# Patient Record
Sex: Male | Born: 1965 | Race: White | Hispanic: No | Marital: Married | Smoking: Never smoker
Health system: Southern US, Community
[De-identification: ages and names within clinical notes are randomized; demographics above are authoritative.]

## PROBLEM LIST (undated history)

## (undated) DIAGNOSIS — K529 Noninfective gastroenteritis and colitis, unspecified: Secondary | ICD-10-CM

## (undated) DIAGNOSIS — F99 Mental disorder, not otherwise specified: Secondary | ICD-10-CM

## (undated) HISTORY — PX: KNEE SURGERY: SHX244

---

## 1999-08-08 ENCOUNTER — Encounter: Payer: Self-pay | Admitting: Gastroenterology

## 1999-08-08 ENCOUNTER — Encounter: Admission: RE | Admit: 1999-08-08 | Discharge: 1999-08-08 | Payer: Self-pay | Admitting: Gastroenterology

## 2000-01-05 ENCOUNTER — Other Ambulatory Visit: Admission: RE | Admit: 2000-01-05 | Discharge: 2000-01-05 | Payer: Self-pay | Admitting: Gastroenterology

## 2000-01-05 ENCOUNTER — Encounter (INDEPENDENT_AMBULATORY_CARE_PROVIDER_SITE_OTHER): Payer: Self-pay | Admitting: Specialist

## 2007-02-25 ENCOUNTER — Emergency Department (HOSPITAL_COMMUNITY): Admission: EM | Admit: 2007-02-25 | Discharge: 2007-02-25 | Payer: Self-pay | Admitting: Emergency Medicine

## 2009-04-14 ENCOUNTER — Emergency Department (HOSPITAL_COMMUNITY): Admission: EM | Admit: 2009-04-14 | Discharge: 2009-04-14 | Payer: Self-pay | Admitting: Emergency Medicine

## 2011-09-11 ENCOUNTER — Encounter: Payer: Self-pay | Admitting: Emergency Medicine

## 2011-09-11 ENCOUNTER — Emergency Department (HOSPITAL_COMMUNITY): Payer: Self-pay

## 2011-09-11 ENCOUNTER — Other Ambulatory Visit: Payer: Self-pay

## 2011-09-11 ENCOUNTER — Emergency Department (HOSPITAL_COMMUNITY)
Admission: EM | Admit: 2011-09-11 | Discharge: 2011-09-11 | Disposition: A | Discharge status: Home / Self Care | Payer: Self-pay | Attending: Emergency Medicine | Admitting: Emergency Medicine

## 2011-09-11 DIAGNOSIS — R6883 Chills (without fever): Secondary | ICD-10-CM | POA: Insufficient documentation

## 2011-09-11 DIAGNOSIS — R079 Chest pain, unspecified: Secondary | ICD-10-CM | POA: Insufficient documentation

## 2011-09-11 DIAGNOSIS — R0602 Shortness of breath: Secondary | ICD-10-CM | POA: Insufficient documentation

## 2011-09-11 DIAGNOSIS — J40 Bronchitis, not specified as acute or chronic: Secondary | ICD-10-CM | POA: Insufficient documentation

## 2011-09-11 HISTORY — DX: Noninfective gastroenteritis and colitis, unspecified: K52.9

## 2011-09-11 MED ORDER — HYDROCOD POLST-CHLORPHEN POLST 10-8 MG/5ML PO LQCR: 5.0000 mL | Freq: Two times a day (BID) | ORAL | Status: DC | PRN

## 2011-09-11 MED ORDER — KETOROLAC TROMETHAMINE 30 MG/ML IJ SOLN
30.0000 mg | Freq: Once | INTRAMUSCULAR | Status: AC
  Administered 2011-09-11: 30 mg via INTRAVENOUS
  Filled 2011-09-11: qty 1

## 2011-09-11 MED ORDER — ALBUTEROL SULFATE HFA 108 (90 BASE) MCG/ACT IN AERS: 1.0000 | INHALATION_SPRAY | Freq: Four times a day (QID) | RESPIRATORY_TRACT | Status: DC | PRN

## 2011-09-11 NOTE — ED Notes (Signed)
Per pt's wife she administered her daughter's albuterol and pt's states it helped and he feels better

## 2011-09-11 NOTE — ED Provider Notes (Signed)
History     CSN: [id number] Arrival date & time: 09/11/2011  4:02 PM   First MD Initiated Contact with Patient 09/11/11 1920      Chief Complaint  Patient presents with  . Chest Pain    (Consider location/radiation/quality/duration/timing/severity/associated sxs/prior treatment) Patient is a 45 y.o. male presenting with chest pain. The history is provided by the patient.  Chest Pain Primary symptoms include shortness of breath, cough and wheezing. Pertinent negatives for primary symptoms include no fever, no palpitations, no abdominal pain, no nausea and no vomiting.  Pertinent negatives for associated symptoms include no diaphoresis.    the patient is a 40, old male, with no past medical history, who does not smoke, who presents to the emergency department complaining of a cough, causing, chest pain, along with mild shortness of breath and wheezing.  For several weeks.  He says when he coughs, it feels like carrying in his chest.  He also has had green sputum.  He denies nausea, vomiting, fevers.  He has had chills.  He denies diaphoresis.  Past Medical History  Diagnosis Date  . Colitis     Past Surgical History  Procedure Date  . Knee surgery     No family history on file.  History  Substance Use Topics  . Smoking status: Never Smoker   . Smokeless tobacco: Not on file  . Alcohol Use: Yes     occasionally      Review of Systems  Constitutional: Positive for chills. Negative for fever and diaphoresis.  HENT: Negative for neck pain.   Eyes: Negative for redness.  Respiratory: Positive for cough, shortness of breath and wheezing. Negative for chest tightness.   Cardiovascular: Positive for chest pain. Negative for palpitations and leg swelling.  Gastrointestinal: Negative for nausea, vomiting, abdominal pain and diarrhea.  Musculoskeletal: Negative for back pain.  Skin: Negative for rash.  Neurological: Negative for headaches.  Psychiatric/Behavioral: Negative  for confusion.  All other systems reviewed and are negative.    Allergies  Codeine; Penicillins; and Tramadol  Home Medications   Current Outpatient Rx  Name Route Sig Dispense Refill  . ACETAMINOPHEN 500 MG PO TABS Oral Take 1,000 mg by mouth every 6 (six) hours as needed. For pain     . ALBUTEROL SULFATE (2.5 MG/3ML) 0.083% IN NEBU Nebulization Take 2.5 mg by nebulization every 6 (six) hours as needed. For shortness of breath       BP 112/64  Pulse 77  Temp(Src) 100.1 F (37.8 C) (Oral)  Resp 18  SpO2 96%  Physical Exam  Constitutional: He is oriented to person, place, and time. He appears well-developed and well-nourished. No distress.  HENT:  Head: Normocephalic and atraumatic.  Eyes: EOM are normal. Pupils are equal, round, and reactive to light.  Neck: Normal range of motion. Neck supple.  Cardiovascular: Normal rate, regular rhythm and normal heart sounds.   No murmur heard. Pulmonary/Chest: Effort normal and breath sounds normal. No respiratory distress. He has no wheezes. He has no rales.  Abdominal: Soft. Bowel sounds are normal. He exhibits no distension and no mass. There is no tenderness. There is no rebound and no guarding.  Musculoskeletal: Normal range of motion. He exhibits no edema and no tenderness.  Neurological: He is alert and oriented to person, place, and time. No cranial nerve deficit.  Skin: Skin is warm and dry. He is not diaphoretic.  Psychiatric: He has a normal mood and affect. His behavior is normal.  ED Course  Procedures (including critical care time) 45 year old male, with persistent cough for several weeks.  Cough causing, chest pain.  He is not hypoxic.  Her respiratory distress.  He has no underlying past medical history does not smoke.  We'll perform a chest x-ray, I suspect that he stopped, bronchitis.  There is no indication for laboratory testing.  At this time to  Labs Reviewed - No data to display No results found for this or  any previous visit. Dg Chest 2 View  09/11/2011  *RADIOLOGY REPORT*  Clinical Data: Cough and chest pain  CHEST - 2 VIEW  Comparison: None.  Findings: The lungs are clear without focal infiltrate, edema, pneumothorax or pleural effusion. Interstitial markings are diffusely coarsened with chronic features. The cardiopericardial silhouette is within normal limits for size. Imaged bony structures of the thorax are intact.  IMPRESSION: Chronic interstitial coarsening without acute findings.  Original Report Authenticated By: ERIC A. MANSELL, M.D.       MDM  Bronchitis No pneumonia, hypoxia, respiratory distress or other acute illness        Nicholes Stairs, MD 09/11/11 2049

## 2011-09-11 NOTE — ED Notes (Signed)
Pt presenting to ed with c/o chest pain with positive nausea, pt reports shortness of breath and pain radiating into his back and between his shoulder blades. Pt states symptoms x over 1 month. Pt states he's also coughing up greenish colored sputum. Pt states it feels like something is ripping apart in his chest when he coughs. Pt states he has also had fevers.

## 2012-09-28 ENCOUNTER — Emergency Department (HOSPITAL_COMMUNITY): Payer: Self-pay

## 2012-09-28 ENCOUNTER — Encounter (HOSPITAL_COMMUNITY): Payer: Self-pay | Admitting: Emergency Medicine

## 2012-09-28 ENCOUNTER — Encounter (HOSPITAL_COMMUNITY): Payer: Self-pay

## 2012-09-28 ENCOUNTER — Emergency Department (HOSPITAL_COMMUNITY)
Admission: EM | Admit: 2012-09-28 | Discharge: 2012-09-28 | Disposition: A | Discharge status: Other Acute Inpatient Hospital | Payer: Self-pay | Attending: Emergency Medicine | Admitting: Emergency Medicine

## 2012-09-28 ENCOUNTER — Inpatient Hospital Stay (HOSPITAL_COMMUNITY)
Admission: RE | Admit: 2012-09-28 | Discharge: 2012-10-03 | DRG: 885 | Disposition: A | Discharge status: Home / Self Care | Payer: Federal, State, Local not specified - Other | Source: Ambulatory Visit | Attending: Psychiatry | Admitting: Psychiatry

## 2012-09-28 DIAGNOSIS — F1410 Cocaine abuse, uncomplicated: Secondary | ICD-10-CM | POA: Insufficient documentation

## 2012-09-28 DIAGNOSIS — F411 Generalized anxiety disorder: Secondary | ICD-10-CM | POA: Insufficient documentation

## 2012-09-28 DIAGNOSIS — F419 Anxiety disorder, unspecified: Secondary | ICD-10-CM

## 2012-09-28 DIAGNOSIS — Z9889 Other specified postprocedural states: Secondary | ICD-10-CM | POA: Insufficient documentation

## 2012-09-28 DIAGNOSIS — F3289 Other specified depressive episodes: Secondary | ICD-10-CM | POA: Insufficient documentation

## 2012-09-28 DIAGNOSIS — Z8719 Personal history of other diseases of the digestive system: Secondary | ICD-10-CM | POA: Insufficient documentation

## 2012-09-28 DIAGNOSIS — Z79899 Other long term (current) drug therapy: Secondary | ICD-10-CM | POA: Insufficient documentation

## 2012-09-28 DIAGNOSIS — F329 Major depressive disorder, single episode, unspecified: Secondary | ICD-10-CM

## 2012-09-28 DIAGNOSIS — F1994 Other psychoactive substance use, unspecified with psychoactive substance-induced mood disorder: Secondary | ICD-10-CM | POA: Diagnosis present

## 2012-09-28 DIAGNOSIS — F489 Nonpsychotic mental disorder, unspecified: Secondary | ICD-10-CM | POA: Insufficient documentation

## 2012-09-28 DIAGNOSIS — F3189 Other bipolar disorder: Principal | ICD-10-CM | POA: Diagnosis present

## 2012-09-28 HISTORY — DX: Mental disorder, not otherwise specified: F99

## 2012-09-28 LAB — COMPREHENSIVE METABOLIC PANEL
ALT: 16 U/L (ref 0–53)
BUN: 16 mg/dL (ref 6–23)
CO2: 25 mEq/L (ref 19–32)
Calcium: 10.1 mg/dL (ref 8.4–10.5)
Creatinine, Ser: 1.05 mg/dL (ref 0.50–1.35)
GFR calc Af Amer: >90 mL/min (ref >90)
GFR calc non Af Amer: 83 mL/min — ABNORMAL LOW (ref >90)
Glucose, Bld: 114 mg/dL — ABNORMAL HIGH (ref 70–99)
Sodium: 137 mEq/L (ref 135–145)
Total Protein: 8.3 g/dL (ref 6.0–8.3)

## 2012-09-28 LAB — CBC
HCT: 43.8 % (ref 39.0–52.0)
Hemoglobin: 15.1 g/dL (ref 13.0–17.0)
MCH: 30.6 pg (ref 26.0–34.0)
MCHC: 34.5 g/dL (ref 30.0–36.0)
MCV: 88.8 fL (ref 78.0–100.0)
RBC: 4.93 MIL/uL (ref 4.22–5.81)

## 2012-09-28 LAB — RAPID URINE DRUG SCREEN, HOSP PERFORMED
Cocaine: POSITIVE — AB
Opiates: NOT DETECTED
Tetrahydrocannabinol: NOT DETECTED

## 2012-09-28 LAB — ETHANOL: Alcohol, Ethyl (B): <11 mg/dL (ref 0–11)

## 2012-09-28 MED ORDER — MAGNESIUM HYDROXIDE 400 MG/5ML PO SUSP: 30.0000 mL | Freq: Every day | ORAL | Status: DC | PRN

## 2012-09-28 MED ORDER — ONDANSETRON HCL 4 MG PO TABS: 4.0000 mg | ORAL_TABLET | Freq: Three times a day (TID) | ORAL | Status: DC | PRN

## 2012-09-28 MED ORDER — ALUM & MAG HYDROXIDE-SIMETH 200-200-20 MG/5ML PO SUSP
30.0000 mL | ORAL | Status: DC | PRN
  Administered 2012-10-02: 30 mL via ORAL

## 2012-09-28 MED ORDER — IBUPROFEN 600 MG PO TABS
600.0000 mg | ORAL_TABLET | Freq: Three times a day (TID) | ORAL | Status: DC | PRN
  Administered 2012-09-28: 600 mg via ORAL
  Filled 2012-09-28: qty 1

## 2012-09-28 MED ORDER — ACETAMINOPHEN 325 MG PO TABS
650.0000 mg | ORAL_TABLET | Freq: Four times a day (QID) | ORAL | Status: DC | PRN
  Administered 2012-09-29 – 2012-10-01 (×4): 650 mg via ORAL

## 2012-09-28 MED ORDER — ALUM & MAG HYDROXIDE-SIMETH 200-200-20 MG/5ML PO SUSP: 30.0000 mL | ORAL | Status: DC | PRN

## 2012-09-28 MED ORDER — ACETAMINOPHEN 325 MG PO TABS
650.0000 mg | ORAL_TABLET | ORAL | Status: DC | PRN
  Administered 2012-09-28: 650 mg via ORAL
  Filled 2012-09-28: qty 2

## 2012-09-28 MED ORDER — ZOLPIDEM TARTRATE 5 MG PO TABS: 5.0000 mg | ORAL_TABLET | Freq: Every evening | ORAL | Status: DC | PRN

## 2012-09-28 MED ORDER — LORAZEPAM 1 MG PO TABS: 1.0000 mg | ORAL_TABLET | Freq: Three times a day (TID) | ORAL | Status: DC | PRN

## 2012-09-28 NOTE — ED Notes (Signed)
Patient is in paper scrubs and sheriff is present
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
## 2012-09-28 NOTE — ED Notes (Signed)
Up on the phone 

## 2012-09-28 NOTE — Tx Team (Signed)
Initial Interdisciplinary Treatment Plan  PATIENT STRENGTHS: (choose at least two) Ability for insight Active sense of humor Average or above average intelligence Capable of independent living Communication skills General fund of knowledge Supportive family/friends  PATIENT STRESSORS: Marital or family conflict   PROBLEM LIST: Problem List/Patient Goals Date to be addressed Date deferred Reason deferred Estimated date of resolution                                                         DISCHARGE CRITERIA:  Ability to meet basic life and health needs Improved stabilization in mood, thinking, and/or behavior Need for constant or close observation no longer present Safe-care adequate arrangements made  PRELIMINARY DISCHARGE PLAN: Participate in family therapy Return to previous living arrangement  PATIENT/FAMIILY INVOLVEMENT: This treatment plan has been presented to and reviewed with the patient, Justin Gross, and/or family member, .  The patient and family have been given the opportunity to ask questions and make suggestions.  Justin Gross 09/28/2012, 3:58 PM

## 2012-09-28 NOTE — ED Notes (Signed)
Up to the bathroom 
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
## 2012-09-28 NOTE — ED Notes (Signed)
Back from x ray, ambulatory

## 2012-09-28 NOTE — ED Notes (Signed)
Patient belongings were placed in locker #36, one bag.
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
## 2012-09-28 NOTE — ED Provider Notes (Signed)
Medical screening examination/treatment/procedure(s) were performed by non-physician practitioner and as supervising physician I was immediately available for consultation/collaboration.  Sunnie Nielsen, MD 09/28/12 [phone]

## 2012-09-28 NOTE — ED Notes (Signed)
Deputy at bedside spoke with pts wife, states she is fearful of pt and is afraid to come to ED. Is willing to speak with MD or ACT. April Quinley [phone]

## 2012-09-28 NOTE — ED Notes (Signed)
Sheriff's dept here to transport  

## 2012-09-28 NOTE — ED Notes (Signed)
1 bag of belongings, MAR, telepsych, ACT and IVC papers sent w/ pt

## 2012-09-28 NOTE — ED Provider Notes (Signed)
History     CSN: [id number]  Arrival date & time 09/28/12  0035   First MD Initiated Contact with Patient 09/28/12 0244      Chief Complaint  Patient presents with  . Medical Clearance    (Consider location/radiation/quality/duration/timing/severity/associated sxs/prior treatment) HPI Comments: Patient comes in via [employer] with IVC paperwork that was filled out by the patient's wife.  The paperwork states that earlier this evening the patient had a gun to his head and was threatening to shoot himself.  Patient denies this.  The wife then called the police.  Patient apparently was combative when police arrived at the home.  When police arrived at the home the patient had a gun in his hand.   Patient denies prior suicide attempt.  He denies prior psychiatric history.  Denies recent alcohol or drug use.    The history is provided by the patient.    Past Medical History  Diagnosis Date  . Colitis     Past Surgical History  Procedure Date  . Knee surgery     No family history on file.  History  Substance Use Topics  . Smoking status: Never Smoker   . Smokeless tobacco: Not on file  . Alcohol Use: Yes     Comment: occasionally      Review of Systems  All other systems reviewed and are negative.    Allergies  Codeine; Penicillins; and Tramadol  Home Medications   Current Outpatient Rx  Name  Route  Sig  Dispense  Refill  . ACETAMINOPHEN 500 MG PO TABS   Oral   Take 1,000 mg by mouth every 6 (six) hours as needed. For pain          . ALBUTEROL SULFATE HFA 108 (90 BASE) MCG/ACT IN AERS   Inhalation   Inhale 1-2 puffs into the lungs every 6 (six) hours as needed for wheezing.   1 Inhaler   0   . ALBUTEROL SULFATE (2.5 MG/3ML) 0.083% IN NEBU   Nebulization   Take 2.5 mg by nebulization every 6 (six) hours as needed. For shortness of breath          . HYDROCOD POLST-CPM POLST ER 10-8 MG/5ML PO LQCR   Oral   Take 5 mLs by mouth every 12 (twelve) hours as  needed.   140 mL   0     BP 145/88  Pulse 105  Temp 98.5 F (36.9 C) (Oral)  Resp 20  SpO2 97%  Physical Exam  Nursing note and vitals reviewed. Constitutional: He appears well-developed and well-nourished. No distress.  HENT:  Head: Normocephalic and atraumatic.  Mouth/Throat: Oropharynx is clear and moist.  Eyes: EOM are normal. Pupils are equal, round, and reactive to light.  Cardiovascular: Normal rate, regular rhythm and normal heart sounds.   Pulmonary/Chest: Effort normal and breath sounds normal.  Neurological: He is alert.  Skin: Skin is warm and dry. He is not diaphoretic.  Psychiatric: His speech is rapid and/or pressured. He is agitated. He is not withdrawn and not combative. He exhibits a depressed mood. He expresses no homicidal and no suicidal ideation. He expresses no suicidal plans and no homicidal plans.    ED Course  Procedures (including critical care time)  Labs Reviewed  CBC - Abnormal; Notable for the following:    WBC 12.4 (*)     All other components within normal limits  COMPREHENSIVE METABOLIC PANEL - Abnormal; Notable for the following:    Glucose, Bld 114 (*)  Alkaline Phosphatase 130 (*)     GFR calc non Af Amer 83 (*)     All other components within normal limits  SALICYLATE LEVEL - Abnormal; Notable for the following:    Salicylate Lvl <2.0 (*)     All other components within normal limits  URINE RAPID DRUG SCREEN (HOSP PERFORMED) - Abnormal; Notable for the following:    Cocaine POSITIVE (*)     All other components within normal limits  ACETAMINOPHEN LEVEL  ETHANOL   No results found.   No diagnosis found.    MDM  Patient presents to the ED with IVC paperwork that had been filled out by patient's wife.  Paperwork states that the patient had a gun to his head and was threatening to kill himself.  Patient discussed with ACT team.  Psych holding orders have been placed.          Pascal Lux [place], PA-C 09/28/12 [phone]

## 2012-09-28 NOTE — ED Notes (Signed)
Dr Denton Lank in w/ pt

## 2012-09-28 NOTE — ED Notes (Signed)
Dr steinl into see 
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
## 2012-09-28 NOTE — ED Notes (Signed)
Per shift report-previous nurse Joanie Coddington)  reported that the phone report from telepsych MD recommended monitoring pt x24 hours and observing/documenting behaviors, then re-telpsysch for eval, Dr Denton Lank aware.

## 2012-09-28 NOTE — ED Notes (Signed)
[employer] contacted for transport 

## 2012-09-28 NOTE — ED Notes (Signed)
ACT in w/ pt 
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
## 2012-09-28 NOTE — BHH Counselor (Addendum)
Patient accepted to [facility] by Alvy Beal, NP to Dr. Geoffery Lyons. Patients bed assignment is 303-1. The EDP-Dr. Cathren Laine was notified and updated with patients disposition. He agreed to discharge patient to [facility] for inpatient treatment. Patients nurse Wille Celeste will also be made aware of patients disposition. The call report # is [phone]. All support paperwork is completed and faxed to [facility]. Patient is involuntary and to be transported via [employer].
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
## 2012-09-28 NOTE — ED Notes (Signed)
ACT into see 

## 2012-09-28 NOTE — Progress Notes (Signed)
Patient did attend the evening speaker AA meeting.  

## 2012-09-29 DIAGNOSIS — F1994 Other psychoactive substance use, unspecified with psychoactive substance-induced mood disorder: Secondary | ICD-10-CM | POA: Diagnosis present

## 2012-09-29 DIAGNOSIS — F1410 Cocaine abuse, uncomplicated: Secondary | ICD-10-CM | POA: Diagnosis present

## 2012-09-29 MED ORDER — ESCITALOPRAM OXALATE 10 MG PO TABS
10.0000 mg | ORAL_TABLET | Freq: Every day | ORAL | Status: DC
  Administered 2012-10-01 – 2012-10-03 (×3): 10 mg via ORAL
  Filled 2012-09-29 (×3): qty 14
  Filled 2012-09-29 (×3): qty 1

## 2012-09-29 MED ORDER — HYDROXYZINE HCL 50 MG PO TABS
50.0000 mg | ORAL_TABLET | Freq: Every evening | ORAL | Status: DC | PRN
  Administered 2012-09-30 – 2012-10-02 (×3): 50 mg via ORAL
  Filled 2012-09-29: qty 1
  Filled 2012-09-29: qty 14
  Filled 2012-09-29 (×2): qty 1

## 2012-09-29 MED ORDER — ARIPIPRAZOLE 5 MG PO TABS
5.0000 mg | ORAL_TABLET | Freq: Every day | ORAL | Status: DC
  Administered 2012-10-01 – 2012-10-03 (×3): 5 mg via ORAL
  Filled 2012-09-29: qty 14
  Filled 2012-09-29 (×2): qty 1
  Filled 2012-09-29: qty 14
  Filled 2012-09-29: qty 1
  Filled 2012-09-29: qty 14

## 2012-09-29 NOTE — Progress Notes (Signed)
GOALS GROUP This group is about being able to set a goal or be able to state what you are working on or grateful for. An Attitude of Gratitude.  Today Pt attended, participated in group interaction and was spontaneous with thoughts and suggestions for the rest of the group.  

## 2012-09-29 NOTE — Progress Notes (Signed)
Psychoeducational Group Note  Date:  09/29/2012 Time:  1015  Group Topic/Focus:  Making Healthy Choices:   The focus of this group is to help patients identify negative/unhealthy choices they were using prior to admission and identify positive/healthier coping strategies to replace them upon discharge.  Participation Level:  Active  Participation Quality:  Appropriate  Affect:  Appropriate  Cognitive:  Alert  Insight:  Developing/Improving  Engagement in Group:  Developing/Improving  Additional Comments:    Justin Gross A 09/29/2012   

## 2012-09-29 NOTE — Progress Notes (Signed)
D   Pt is appropriate and pleasant   He attends and participates in groups  He interacts well with others  He denies SI/HI He denies A/V hallucinations    His thinking is logical and speech coherent   He denies any symptoms of withdrawal A   Verbal support given   Medications administered and effectiveness monitored   Q 15 min checks R   Pt safe at present

## 2012-09-29 NOTE — [facility] Suicide Risk Assessment (Addendum)
Suicide Risk Assessment  Admission Assessment     Demographic factors:  Assessment Details Time of Assessment: Admission Information Obtained From: Patient Current Mental Status:  Current Mental Status: NA Loss Factors:  Loss Factors: NA Historical Factors:  Historical Factors: NA Risk Reduction Factors:  Risk Reduction Factors: Sense of responsibility to family;Religious beliefs about death;Employed;Positive social support;Positive coping skills or problem solving skills  CLINICAL FACTORS:   Depression:   Anhedonia Comorbid alcohol abuse/dependence Impulsivity Chronic Pain  COGNITIVE FEATURES THAT CONTRIBUTE TO RISK:  Polarized thinking    SUICIDE RISK:   Minimal: No identifiable suicidal ideation.  Patients presenting with no risk factors but with morbid ruminations; may be classified as minimal risk based on the severity of the depressive symptoms  PLAN OF CARE: 1.  Participate in group sessions, talk with Child psychotherapist. Pt has  IVC for 5 days 2.  Pt will report any side effects of medication . 3.  Pt will report any thought of suicide at lese two days prior to discharge. 4   Pt may be referred to outpatient therapy.   Justin Gross 09/29/2012, 10:31 AM

## 2012-09-29 NOTE — H&P (Signed)
Psychiatric Admission Assessment Adult  Patient Identification:  Justin Gross  Date of Evaluation:  09/29/2012  Chief Complaint:  MOOD D/O,NOS  History of Present Illness: This is a 46 year old Caucasian male, admitted to [facility] from the [facility] ED with complaints of suicidal threats with a gun. Patient is currently under IVC. Patient reports, "The cops took me to the hospital on Friday (2 days ago) against my will. What happened was, on Thursday night, I had dozed off while on the couch watching the television. I woke up and made it to the bedroom where my wife was sleeping. I started to lie down beside my wife when she said, get out of this bed. Go on. My wife does this sometimes because she is going through menopause right now. She gets hot-flash often, and when she is having her hot flash, she will become cranky too. I got upset and said okay, I am going out of the house, with hope that she will get worried and call me back to the bed. [place] for protection and also for hunting. I'm licensed to carry a concealed weapon as well. I was not going to go outside of the house that time of the night without protection. My wife saw me take the gun and called 911. I went out of the house, cooled off, got back in the house when I saw bunch of flashing lights. I called my son and asked him what was going on outside? I don't know he answered. So, I took my pistol and opened the door to see what it was. I was also worried if those were burglars as well. The next thing I saw where bunch of cops rushing into my house, including the sheriff. They threw me on the ground saying that I was threatening suicide and waving a gun at my family. That was when I realized that my wife had actually called them because she was scared when she saw me take my gun. She does not like guns. I am not depressed, suicidal and or homicidal. I love my life, my family and my job. 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 I love what I do. 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 But I don't do it all the time. I decline to be on any medication at this time".   Elements:  Location:  [facility] adult unit. Quality:  "The whole incident that led to be being here is making me worried". Severity:  "I'm shaking up pretty bad by this because I'm not a violent persont". Timing:  "It started Christmas night whem my wife asked me to get out of our bed". Duration:  "It lasted just that night, until the cops showed up at my house".. 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  Associated Signs/Synptoms:  Depression Symptoms:  "I'm not depressed, and I don't feel depressed"  (Hypo) Manic Symptoms:  Flight of Ideas,  Anxiety Symptoms:  Excessive Worry,  Psychotic Symptoms:  Hallucinations: None  PTSD Symptoms: Had a traumatic exposure:  Denies  Psychiatric Specialty Exam: Physical Exam  Constitutional: He is oriented to person, place, and time. He appears well-developed and well-nourished.  HENT:  Head: Normocephalic.  Eyes: Pupils are equal, round, and reactive  to light.  Neck: Normal range of motion.  Cardiovascular: Normal rate.   Respiratory: Effort normal.  GI: Soft.  Musculoskeletal: Normal range of motion.  Neurological: He is alert and oriented to person, place, and time.  Skin: Skin is warm and dry.  Psychiatric: His behavior is normal. Thought content normal. His mood appears anxious. His speech is tangential (Talkative and fast). He expresses impulsivity.    Review of Systems  Constitutional: Negative.   HENT: Negative.   Respiratory: Negative.   Cardiovascular: Negative.   Gastrointestinal: Negative.   Genitourinary: Negative.   Musculoskeletal: Negative.   Skin: Positive for rash (Small abrasion back of left wrists.).    Neurological: Negative.   Endo/Heme/Allergies: Negative.   Psychiatric/Behavioral: Positive for substance abuse ("I use cocaine occasionally"). Negative for depression, suicidal ideas, hallucinations and memory loss. The patient is nervous/anxious ("I'm anxious about being here"). The patient does not have insomnia.     Blood pressure 135/88, pulse 79, temperature 97.1 F (36.2 C), temperature source Oral, resp. rate 20.There is no height or weight on file to calculate BMI.  General Appearance: Fairly Groomed  Patent attorney::  Good  Speech:  Clear and Coherent and Fast  Volume:  Increased  Mood:  "Up scale, I feel very good"  Affect:  Appropriate  Thought Process:  Tangential  Orientation:  Full (Time, Place, and Person)  Thought Content:  Denies hallucination, delusions and or paranoia  Suicidal Thoughts:  No  Homicidal Thoughts:  No  Memory:  Immediate;   Good Recent;   Good Remote;   Good  Judgement:  Fair  Insight:  Lacking  Psychomotor Activity:  Restlessness  Concentration:  Fair  Recall:  Good  Akathisia:  No  Handed:  Right  AIMS (if indicated):     Assets:  Desire for Improvement  Sleep:  Number of Hours: 3.75     Past Psychiatric History: Diagnosis: Cocaine abuse  Hospitalizations: [facility]  Outpatient Care: None reported  Substance Abuse Care: None reported  Self-Mutilation: Denies  Suicidal Attempts: Denies attempts and or thoughts.  Violent Behaviors: None reported   Past Medical History:   Past Medical History  Diagnosis Date  . Colitis   . Mental disorder     Allergies:   Allergies  Allergen Reactions  . Codeine Nausea Only  . Penicillins Other (See Comments)    Causes chron's flare up  . Tramadol Nausea Only   PTA Medications: Prescriptions prior to admission  Medication Sig Dispense Refill  . acetaminophen (TYLENOL) 500 MG tablet Take 1,000 mg by mouth every 6 (six) hours as needed. For pain       . albuterol (PROVENTIL HFA;VENTOLIN HFA) 108 (90  BASE) MCG/ACT inhaler Inhale 1-2 puffs into the lungs every 6 (six) hours as needed for wheezing.  1 Inhaler  0  . Multiple Vitamin (MULTIVITAMIN WITH MINERALS) TABS Take 1 tablet by mouth every morning.        Previous Psychotropic Medications:  Medication/Dose  "I use inhalers sometimes"               Substance Abuse History in the last 12 months:  yes  Consequences of Substance Abuse: Medical Consequences:  Liver damage, Possible death by overdose Legal Consequences:  Arrests, jail time, Loss of driving privilege. Family Consequences:  Family discord, divorce and or separation.   Social History:  reports that he has never smoked. He does not have any smokeless tobacco history on file. He reports that he drinks alcohol. He  reports that he does not use illicit drugs. Additional Social History:  Current Place of Residence: [place], [place]   Place of Birth: [place], [place]   Family Members: "My wife and children"  Marital Status:  Married  Children: 3  Sons: 1  Daughters: 2  Relationships: Married  Education:  GED  Educational Problems/Performance: "I got my GED"  Religious Beliefs/Practices: "I'm a christian"  History of Abuse (Emotional/Phsycial/Sexual): None reported  Occupational Experiences: Employed  Hotel manager History:  None.  Legal History: None reported  Hobbies/Interests: None reported  Family History:  No family history on file.  Results for orders placed during the hospital encounter of 09/28/12 (from the past 72 hour(s))  URINE RAPID DRUG SCREEN (HOSP PERFORMED)     Status: Abnormal   Collection Time   09/28/12  1:24 AM      Component Value Range Comment   Opiates NONE DETECTED  NONE DETECTED    Cocaine POSITIVE (*) NONE DETECTED    Benzodiazepines NONE DETECTED  NONE DETECTED    Amphetamines NONE DETECTED  NONE DETECTED    Tetrahydrocannabinol NONE DETECTED  NONE DETECTED    Barbiturates NONE DETECTED  NONE DETECTED   ACETAMINOPHEN  LEVEL     Status: Normal   Collection Time   09/28/12  1:40 AM      Component Value Range Comment   Acetaminophen (Tylenol), Serum <15.0  10 - 30 ug/mL   CBC     Status: Abnormal   Collection Time   09/28/12  1:40 AM      Component Value Range Comment   WBC 12.4 (*) 4.0 - 10.5 K/uL    RBC 4.93  4.22 - 5.81 MIL/uL    Hemoglobin 15.1  13.0 - 17.0 g/dL    HCT 32.4  40.1 - 02.7 %    MCV 88.8  78.0 - 100.0 fL    MCH 30.6  26.0 - 34.0 pg    MCHC 34.5  30.0 - 36.0 g/dL    RDW 25.3  66.4 - 40.3 %    Platelets 275  150 - 400 K/uL   COMPREHENSIVE METABOLIC PANEL     Status: Abnormal   Collection Time   09/28/12  1:40 AM      Component Value Range Comment   Sodium 137  135 - 145 mEq/L    Potassium 3.6  3.5 - 5.1 mEq/L    Chloride 101  96 - 112 mEq/L    CO2 25  19 - 32 mEq/L    Glucose, Bld 114 (*) 70 - 99 mg/dL    BUN 16  6 - 23 mg/dL    Creatinine, Ser 4.74  0.50 - 1.35 mg/dL    Calcium 25.9  8.4 - 10.5 mg/dL    Total Protein 8.3  6.0 - 8.3 g/dL    Albumin 4.6  3.5 - 5.2 g/dL    AST 17  0 - 37 U/L    ALT 16  0 - 53 U/L    Alkaline Phosphatase 130 (*) 39 - 117 U/L    Total Bilirubin 0.4  0.3 - 1.2 mg/dL    GFR calc non Af Amer 83 (*) >90 mL/min    GFR calc Af Amer >90  >90 mL/min   ETHANOL     Status: Normal   Collection Time   09/28/12  1:40 AM      Component Value Range Comment   Alcohol, Ethyl (B) <11  0 - 11 mg/dL   SALICYLATE LEVEL  Status: Abnormal   Collection Time   09/28/12  1:40 AM      Component Value Range Comment   Salicylate Lvl <2.0 (*) 2.8 - 20.0 mg/dL    Psychological Evaluations:  Assessment:   AXIS I:  Cocaine abuse AXIS II:  Deferred AXIS III:   Past Medical History  Diagnosis Date  . Colitis   . Mental disorder    AXIS IV:  other psychosocial or environmental problems AXIS V:  11-20 some danger of hurting self or others possible OR occasionally fails to maintain minimal personal hygiene OR gross impairment in communication    Treatment  Plan/Recommendations: 1. Admit for crisis management and stabilization.  2. Medication management to reduce current symptoms to base line and improve the patient's overall level of functioning  3. Treat health problems as indicated.  4. Develop treatment plan to decrease risk of relapse upon discharge and the need for readmission.  5. Psycho-social education regarding relapse prevention and self care.  6. Health care follow up as needed for medical problems.  7. Review and reinstate any pertinent home medications for other issues where appropriate.   Treatment Plan Summary: Daily contact with patient to assess and evaluate symptoms and progress in treatment Medication management  Current Medications:  Current Facility-Administered Medications  Medication Dose Route Frequency Provider Last Rate Last Dose  . acetaminophen (TYLENOL) tablet 650 mg  650 mg Oral Q6H PRN Shuvon Rankin, NP   650 mg at 09/29/12 0055  . alum & mag hydroxide-simeth (MAALOX/MYLANTA) 200-200-20 MG/5ML suspension 30 mL  30 mL Oral Q4H PRN Shuvon Rankin, NP      . magnesium hydroxide (MILK OF MAGNESIA) suspension 30 mL  30 mL Oral Daily PRN Shuvon Rankin, NP        Observation Level/Precautions:  Q15 minute safety checks.  Laboratory:  Reviewed ED lab findings on file.  Psychotherapy:  Group sessions  Medications:  See medication lists  Consultations:  None indicated at this time.  Discharge Concerns:  Safety and sobriety  Estimated LOS: 3-5 days  Other:     I certify that inpatient services furnished can reasonably be expected to improve the patient's condition.   Armandina Stammer I 12/29/20139:07 AM

## 2012-09-29 NOTE — Clinical Social Work Psychosocial (Signed)
[facility] Group Notes: (Clinical Social Work)   09/29/2012      Type of Therapy:  Group Therapy   Participation Level:  Did Not Attend    Ambrose Mantle, LCSW 09/29/2012, 12:21 PM

## 2012-09-29 NOTE — Progress Notes (Signed)
D.  Pt bright and pleasant on approach.  Denies complaints at this time.  Denies SI/HI/hallucinations.  Positive for evening AA group, interacting appropriately within the milieu.  A.  Support and encouragement offered  R.  No distress noted, will continue to monitor.

## 2012-09-29 NOTE — Progress Notes (Signed)
Patient did attend the evening speaker AA meeting.  

## 2012-09-30 ENCOUNTER — Encounter (HOSPITAL_COMMUNITY): Payer: Self-pay | Admitting: Psychiatry

## 2012-09-30 DIAGNOSIS — F1410 Cocaine abuse, uncomplicated: Secondary | ICD-10-CM

## 2012-09-30 DIAGNOSIS — F1994 Other psychoactive substance use, unspecified with psychoactive substance-induced mood disorder: Secondary | ICD-10-CM

## 2012-09-30 DIAGNOSIS — F1010 Alcohol abuse, uncomplicated: Secondary | ICD-10-CM

## 2012-09-30 NOTE — Progress Notes (Signed)
Patient ID: Justin Gross, male   DOB: [birth year], 46 y.o.   MRN: [id number]  He has been up and about and to groups interacting with peers and staff. He refused to take his morning medication until after he sees th DR. He denies having any problems that he is OK.

## 2012-09-30 NOTE — Progress Notes (Signed)
[facility] INPATIENT:  Family/Significant Other Suicide Prevention Education  Suicide Prevention Education:  Education Completed; Destin Stiefel - son [phone]),  (name of family member/significant other) has been identified by the patient as the family member/significant other with whom the patient will be residing, and identified as the person(s) who will aid the patient in the event of a mental health crisis (suicidal ideations/suicide attempt).  With written consent from the patient, the family member/significant other has been provided the following suicide prevention education, prior to the and/or following the discharge of the patient.  The suicide prevention education provided includes the following:  Suicide risk factors  Suicide prevention and interventions  National Suicide Hotline telephone number  [facility] assessment telephone number  [facility] Emergency Assistance 911  [facility] and/or Residential Mobile Crisis Unit telephone number  Request made of family/significant other to:  Remove weapons (e.g., guns, rifles, knives), all items previously/currently identified as safety concern.    Remove drugs/medications (over-the-counter, prescriptions, illicit drugs), all items previously/currently identified as a safety concern.  The family member/significant other verbalizes understanding of the suicide prevention education information provided.  The family member/significant other agrees to remove the items of safety concern listed above. Mr. Czaja states that he has no concerns about pt discharging.  Mr. Erekson states that pt was brought to the hospital because his mom (pt's wife) was concerned about pt's cocaine use.  Mr. Rotert states that his mom got scared and they got into an argument but believes it got out of hand.    Carmina Miller 09/30/2012, 2:53 PM

## 2012-09-30 NOTE — Progress Notes (Signed)
[facility] MD Progress Note  09/30/2012 8:37 AM Justin Gross  MRN:  [id number] Subjective:  Recounts what happened that led him to be admitted. He minimizes his use of cocaine and his use of alcohol.. He denies he was attempting to hurt himself or his family. He rationalizes.  Diagnosis:   Axis I: Alcohol, cocaine abuse, Substace induced mood disorder Axis II: Deferred Axis III:  Past Medical History  Diagnosis Date  . Colitis   . Mental disorder    Axis IV: economic problems, occupational problems and problems with primary support group Axis V: 51-60 moderate symptoms  ADL's:  Intact  Sleep: Fair  Appetite:  Fair  Suicidal Ideation:  Plan:  denies Intent:  denies Means:  denies Homicidal Ideation:  Plan:  denies Intent:  denies Means:  denies AEB (as evidenced by):  Psychiatric Specialty Exam: Review of Systems  Constitutional: Negative.   HENT: Negative.   Eyes: Negative.   Respiratory: Negative.   Cardiovascular: Negative.   Gastrointestinal:       Chron's (stable)  Genitourinary: Negative.   Musculoskeletal: Positive for back pain.  Skin: Negative.   Neurological: Negative.   Endo/Heme/Allergies: Negative.   Psychiatric/Behavioral: Positive for substance abuse. The patient is nervous/anxious and has insomnia.     Blood pressure 144/88, pulse 66, temperature 97.1 F (36.2 C), temperature source Oral, resp. rate 20.There is no height or weight on file to calculate BMI.  General Appearance: Fairly Groomed  Patent attorney::  Fair  Speech:  Clear and Coherent and rapid  Volume:  Normal  Mood:  Anxious and worried  Affect:  anxious  Thought Process:  Coherent and Goal Directed  Orientation:  Full (Time, Place, and Person)  Thought Content:  worries, wanting to clarify what happened, minimizes, rationalizes  Suicidal Thoughts:  No  Homicidal Thoughts:  No  Memory:  Immediate;   Fair Recent;   Fair Remote;   Fair  Judgement:  Fair  Insight:  superficial    Psychomotor Activity:  Restlessness  Concentration:  Fair  Recall:  Fair  Akathisia:  No  Handed:  Right  AIMS (if indicated):     Assets:  Housing Social Support  Sleep:  Number of Hours: 5.5    Current Medications: Current Facility-Administered Medications  Medication Dose Route Frequency Provider Last Rate Last Dose  . acetaminophen (TYLENOL) tablet 650 mg  650 mg Oral Q6H PRN Shuvon Rankin, NP   650 mg at 09/29/12 2236  . alum & mag hydroxide-simeth (MAALOX/MYLANTA) 200-200-20 MG/5ML suspension 30 mL  30 mL Oral Q4H PRN Shuvon Rankin, NP      . ARIPiprazole (ABILIFY) tablet 5 mg  5 mg Oral Daily Mickeal Skinner, MD      . escitalopram (LEXAPRO) tablet 10 mg  10 mg Oral Daily Mickeal Skinner, MD      . hydrOXYzine (ATARAX/VISTARIL) tablet 50 mg  50 mg Oral QHS PRN Sanjuana Kava, NP      . magnesium hydroxide (MILK OF MAGNESIA) suspension 30 mL  30 mL Oral Daily PRN Shuvon Rankin, NP        Lab Results: No results found for this or any previous visit (from the past 48 hour(s)).  Physical Findings: AIMS: Facial and Oral Movements Muscles of Facial Expression: None, normal Lips and Perioral Area: None, normal Jaw: None, normal Tongue: None, normal,Extremity Movements Upper (arms, wrists, hands, fingers): None, normal Lower (legs, knees, ankles, toes): None, normal, Trunk Movements Neck, shoulders, hips: None, normal, Overall Severity Severity of  abnormal movements (highest score from questions above): None, normal Incapacitation due to abnormal movements: None, normal Patient's awareness of abnormal movements (rate only patient's report): No Awareness, Dental Status Current problems with teeth and/or dentures?: No Does patient usually wear dentures?: No  CIWA:  CIWA-Ar Total: 0  COWS:     Treatment Plan Summary: Daily contact with patient to assess and evaluate symptoms and progress in treatment Medication management  Plan: Supportive approach/coping skills/relapse  prevention           Get more information/ reassess for safety  Medical Decision Making Problem Points:  Review of psycho-social stressors (1) Data Points:  Review of medication regiment & side effects (2)  I certify that inpatient services furnished can reasonably be expected to improve the patient's condition.   Justin Gross A 09/30/2012, 8:37 AM

## 2012-09-30 NOTE — Clinical Social Work Note (Signed)
[facility] LCSW Aftercare Discharge Planning Group Note  09/30/2012 8:45 AM  Participation Quality:  Appropriate and Attentive  Affect:  Appropriate  Cognitive:  Alert and Appropriate  Insight:  Developing/Improving  Engagement in Group:  Developing/Improving  Modes of Intervention:  Clarification, Discussion, Education, Exploration, Orientation, Problem-solving, Rapport Building, Socialization and Support  Summary of Progress/Problems: Pt attended discharge planning group and actively participated in group.  CSW provided pt with today's workbook.  Pt presents with calm mood and affect.  Pt rates depression at a 0 and anxiety at a 3 today.  Pt denies SI/HI.  Pt states that he came to the hospital for using drugs and alcohol.  Pt states that he is concerned because he is missing work and needs to pay bills.  Pt states that he lives in [place] and can return home.  CSW will assess for appropriate referrals for outpatient services.  No further needs voiced by pt at this time.    Justin Gross, LCSWA 09/30/2012 10:36 AM

## 2012-09-30 NOTE — [facility] Counselor (Signed)
Adult Comprehensive Assessment  Patient ID: Justin Gross, male   DOB: 03/11/1966, 46 y.o.   MRN: [id number]  Information Source: Information source: Patient  Current Stressors:  Educational / Learning stressors: N/A Employment / Job issues: N/A Family Relationships: N/A Surveyor, quantity / Lack of resources (include bankruptcy): N/A Housing / Lack of housing: N/A Physical health (include injuries & life threatening diseases): Back pain Social relationships: N/A Substance abuse: Alcohol and drug use Bereavement / Loss: N/A  Living/Environment/Situation:  Living Arrangements: Spouse/significant other;Children Living conditions (as described by patient or guardian): Pt states that his home environment is good and stable.   How long has patient lived in current situation?: 26 years What is atmosphere in current home: Dangerous;Comfortable;Supportive  Family History:  Marital status: Married Number of Years Married: 26  What types of issues is patient dealing with in the relationship?: Pt states that he has a good marriage and no major issues Additional relationship information: N/A Does patient have children?: Yes How many children?: 3  How is patient's relationship with their children?: 23, 65 and 33 year old  Childhood History:  By whom was/is the patient raised?: Both parents Additional childhood history information: Pt states that his childhood was good. Description of patient's relationship with caregiver when they were a child: Pt states that he got along with his parents well growing up. Patient's description of current relationship with people who raised him/her: Pt states that he has a good relationship with both parents today.   Does patient have siblings?: Yes Number of Siblings: 2  Description of patient's current relationship with siblings: Pt states that he has a good relationship with both siblings  Did patient suffer any verbal/emotional/physical/sexual abuse as a  child?: No Did patient suffer from severe childhood neglect?: No Has patient ever been sexually abused/assaulted/raped as an adolescent or adult?: No Was the patient ever a victim of a crime or a disaster?: No Witnessed domestic violence?: No Has patient been effected by domestic violence as an adult?: No  Education:  Highest grade of school patient has completed: 10th grade, GED Currently a student?: No Learning disability?: No  Employment/Work Situation:   Employment situation: Employed Where is patient currently employed?: Engineer, technical sales How long has patient been employed?: 3 months Patient's job has been impacted by current illness: No What is the longest time patient has a held a job?: 20 years Where was the patient employed at that time?: Surveyor, minerals for [employer] Has patient ever been in the [employer]?: No Has patient ever served in Buyer, retail?: No  Financial Resources:   Financial resources: Income from employment;Food stamps Does patient have a representative payee or guardian?: No  Alcohol/Substance Abuse:   What has been your use of drugs/alcohol within the last 12 months?: cocaine - 1/2 gram every few days If attempted suicide, did drugs/alcohol play a role in this?: No Alcohol/Substance Abuse Treatment Hx: Denies past history Has alcohol/substance abuse ever caused legal problems?: No  Social Support System:   Patient's Community Support System: Good Describe Community Support System: Pt states that his wife and family are supportive Type of faith/religion: Baptist How does patient's faith help to cope with current illness?: Prayer, regular church attendance  Leisure/Recreation:   Leisure and Hobbies: Horses, Motorcycles, four wheeling, hunting and fishing  Strengths/Needs:   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  Patient lives in [place] with his wife and children.  Patient will benefit from crisis stabilization, medication evaluation, group therapy and psycho education in addition to case management for discharge planning.      Horton, Salome Arnt. 09/30/2012

## 2012-09-30 NOTE — Clinical Social Work Note (Signed)
[facility] LCSW Group Therapy  09/30/2012  1:15 PM   Type of Therapy:  Group Therapy  Participation Level:  Active  Participation Quality:  Appropriate and Attentive  Affect:  Appropriate  Cognitive:  Alert and Appropriate  Insight:  Developing/Improving  Engagement in Therapy:  Developing/Improving  Modes of Intervention:  Clarification, Confrontation, Discussion, Education, Exploration, Limit-setting, Problem-solving, Rapport Building, Socialization and Support  Summary of Progress/Problems: The topic for group today was overcoming obstacles.  Pt discussed overcoming obstacles and what this means for pt. Pt actively listened to group discussion but did not participate.      Reyes Ivan, LCSWA 09/30/2012 2:24 PM

## 2012-09-30 NOTE — Clinical Social Work Note (Signed)
Pt originally agreed for CSW to talk with his wife, April Klipfel.  Pt than caught CSW in the hall and asked to rescind that consent.  CSW spoke with pt's son to provide suicide prevention information.  Shortly after getting off the phone with son, pt's wife called CSW.  Mrs. Shreffler was very upset that CSW called pt's son instead of her.  Mrs. Latu states that she knows pt doesn't want anyone to talk to her because she will tell the truth.  Mrs. Razzano states that she is very scared of pt and when he d/c.  Mrs. Kaufhold states that he has a lot of rage and tore up the front yard.  Mrs. Tonche states that he snaps and is unstable, having mood swings.  Mrs. Amacher states that she doesn't believe that she or the 47 year old child are safe when he d/c.  Mrs. Mattie states that she has been dealing with this for 15 years.  Mrs. Kernes states that the hospital will be liable if he d/c and something happens to her or her children. Mrs. Ferrell states that pt threatened to kill her and take her with him.  Mrs. Sylvestre believes this change is from the substance use, which she reports has increasingly gotten worse.  Mrs. Einstein reports that pt had a pistol but it was taken by the police when he was picked up.

## 2012-09-30 NOTE — Progress Notes (Signed)
Psychoeducational Group Note  Date:  09/30/2012 Time:  1100  Group Topic/Focus:  Self Care:   The focus of this group is to help patients understand the importance of self-care in order to improve or restore emotional, physical, spiritual, interpersonal, and financial health.  Participation Level:  Active  Participation Quality:  Appropriate, Sharing and Supportive  Affect:  Appropriate  Cognitive:  Appropriate  Insight:  Supportive  Engagement in Group:  Supportive  Additional Comments:  none   Tecson M 09/30/2012, 6:55 PM

## 2012-09-30 NOTE — Tx Team (Signed)
Interdisciplinary Treatment Plan Update (Adult)  Date:  09/30/2012  Time Reviewed:  9:48 AM   Progress in Treatment: Attending groups: Yes Participating in groups:  Yes Taking medication as prescribed: Yes Tolerating medication:  Yes Family/Significant othe contact made:  CSW assessing for appropriate contact Patient understands diagnosis:  Yes Discussing patient identified problems/goals with staff:  Yes Medical problems stabilized or resolved:  Yes Denies suicidal/homicidal ideation: Yes Issues/concerns per patient self-inventory:  None identified Other: N/A  New problem(s) identified: None Identified  Reason for Continuation of Hospitalization: Anxiety Depression Medication stabilization  Interventions implemented related to continuation of hospitalization: mood stabilization, medication monitoring and adjustment, group therapy and psycho education, suicide risk assessment, collateral contact, aftercare planning, ongoing physician assessments and safety checks q 15 mins  Additional comments: N/A  Estimated length of stay: 3-5 days  Discharge Plan: CSW is assessing for appropriate referrals.    New goal(s): N/A  Review of initial/current patient goals per problem list:    1.  Goal(s): Address substance use by completing detox protocol  Met:  No  Target date: 4 days  As evidenced by: attending groups and actively participating.    2.  Goal (s): Reduce depressive symptoms from a 10 to a 3  Met:  Yes  Target date: 3-5 days  As evidenced by: Pt rates at a 0 today.    3.  Goal (s): Reduce anxiety symptoms from a 10 to a 3  Met:  Yes  Target date:  3-5 days  As evidenced by: Pt rates at a  3 today.     Attendees: Patient:     Family:     Physician: Geoffery Lyons, MD 09/30/2012 9:48 AM   Nursing: Robbie Louis, RN 09/30/2012 9:48 AM   Clinical Social Worker:  Reyes Ivan, LCSWA 09/30/2012  9:48 AM   Other: Roswell Miners, RN 09/30/2012  9:48 AM   Other:   Serena Colonel, NP 09/30/2012 9:50 AM   Other:  Verna Czech, LCSW 09/30/2012 9:50 AM   Other:     Other:      Scribe for Treatment Team:   Reyes Ivan 09/30/2012 9:48 AM

## 2012-10-01 MED ORDER — HYDROXYZINE HCL 50 MG PO TABS: 50.0000 mg | ORAL_TABLET | Freq: Every evening | ORAL | Status: DC | PRN

## 2012-10-01 MED ORDER — ESCITALOPRAM OXALATE 10 MG PO TABS: 10.0000 mg | ORAL_TABLET | Freq: Every day | ORAL | Status: DC

## 2012-10-01 MED ORDER — ARIPIPRAZOLE 5 MG PO TABS: 5.0000 mg | ORAL_TABLET | Freq: Every day | ORAL | Status: DC

## 2012-10-01 NOTE — Discharge Summary (Signed)
Physician Discharge Summary Note  Patient:  Justin Gross is an 46 y.o., male MRN:  [id number] DOB:  [birth year] Patient phone:  [phone] (home)  Patient address:   [address] [place] [address],   Date of Admission:  09/28/2012  Date of Discharge: 10/03/12  Reason for Admission:  Suicidal threats  Discharge Diagnoses: Principal Problem:  *Cocaine abuse Active Problems:  Substance induced mood disorder  Review of Systems  Constitutional: Negative.   HENT: Negative.   Eyes: Positive for blurred vision (Wears eye glasses.).  Respiratory: Negative.   Cardiovascular: Negative.   Gastrointestinal: Negative.   Genitourinary: Negative.   Musculoskeletal: Positive for back pain (Hx of).  Skin: Negative.   Neurological: Negative.   Endo/Heme/Allergies: Negative.   Psychiatric/Behavioral: Positive for depression (Currently stabilized with medication prior to discharge.) and substance abuse (Hx. Cocaine abuse). Negative for suicidal ideas, hallucinations and memory loss. The patient is nervous/anxious (Stabilized with medication upon discharge.) and has insomnia.    Axis Diagnosis:   AXIS I:  Hypomanic bipolar II disorder, Cocaine abuse AXIS II:  Deferred AXIS III:   Past Medical History  Diagnosis Date  . Colitis   . Mental disorder    AXIS IV:  other psychosocial or environmental problems AXIS V:  65  Level of Care:  OP  Hospital Course:  This is a 46 year old Caucasian male, admitted to [facility] from the [facility] ED with complaints of suicidal threats with a gun. Patient is currently under IVC. Patient reports, "The cops took me to the hospital on Friday (2 days ago) against my will. What happened was, on Thursday night, I had dozed off while on the couch watching the television. I woke up and made it to the bedroom where my wife was sleeping. I started to lie down beside my wife when she said, get out of this bed. Go on. My wife does this sometimes  because she is going through menopause right now. She gets hot-flash often, and when she is having her hot flash, she will become cranky too. I got upset and said okay, I am going out of the house, with hope that she will get worried and call me back to the bed. [place] gun for protection and also for hunting. I'm licensed to carry a concealed weapon as well. I was not going to go outside of the house that time of the night without protection. My wife saw me take the gun and called 911. I went out of the house, cooled off, got back in the house when I saw bunch of flashing lights. I called my son and asked him what was going on outside? I don't know he answered. So, I took my pistol and opened the door to see what it was. I was also worried if those were burglars as well. The next thing I saw where bunch of cops rushing into my house, including the sheriff. They threw me on the ground saying that I was threatening suicide and waving a gun at my family. That was when I realized that my wife had actually called them because she was scared when she saw me take my gun. She does not like guns. I am not depressed, suicidal and or homicidal. I love my life, my family and my job. 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 I love what I do. I use cocaine occasionally to stay up at work because I work grave yard  shift. But I don't do it all the time. I decline to be on any medication at this time".   After admission assessment and evaluation, it was determined that Mr. Sarkisyan  will need medication management to stabilize his hypomanic mood. He was therefore prescribed and received Abilify 5 mg daily for mood control, Escitalopram (Lexapro) 10 mg daily for depression, Gabapentin 300 mg three times daily for anxiety control and Hydroxyzine 50 mg for anxiety/sleep. And his discharge plans included scheduling patient an outpatient psychiatric clinic for post hospitalization follow-up and routine medication  management to maintain mood stabilization. Mr. Boniface was also enrolled in group counseling sessions and activities to learn coping skills that will help him after discharge to cope better, manage his substance abuse problems to maintain a much longer sobriety and a stable mental health condition.  He also was enrolled and attended AA/NA meetings being offered and held on this unit. He has some previous and or identifiable medical conditions, although not active, but required close monitoring.  He was monitored closely for any potential problems that may arise as a result of and or during his treatment regimen. Patient tolerated his treatment regimen without any significant adverse effects and or reactions reported.  Patient attended treatment team meeting this am and met with the team. His symptoms, substance abuse issues, response to treatment and discharge plans discussed. Patient endorsed that he is doing well and stable for discharge to pursue the next phase of his treatment on outpatient basis. It was also confirmed by the social worker that patient's family did not raise any objection about patient returning home to them. During this discharge meeting, It was agreed upon between patient and the team that he will be discharged to his home with family. He will continue psychiatric care on outpatient basis at [facility] here in [place], [place] on 10/08/12 @ 08:am with Dr. Eulah Pont. Patient is instructed that this is a walk-in appointed between the hours of 08:00 am and 03:00 pm, and should make adequate effort to make this appointment in a timely manner.  Upon discharge, patient adamantly denies suicidal, homicidal ideations, auditory, visual hallucinations, delusional thinking and or withdrawal symptoms. Patient left [facility] with all personal belongings in no apparent distress. He received 2 weeks worth samples of his discharge medications. Transportation per per family.    Consults:  None  Significant  Diagnostic Studies:  labs: CBC with diff, CMP, UDS, Toxicology tests.  Discharge Vitals:   Blood pressure 134/88, pulse 60, temperature 98.1 F (36.7 C), temperature source Oral, resp. rate 16. There is no height or weight on file to calculate BMI. Lab Results:   No results found for this or any previous visit (from the past 72 hour(s)).  Physical Findings: AIMS: Facial and Oral Movements Muscles of Facial Expression: None, normal Lips and Perioral Area: None, normal Jaw: None, normal Tongue: None, normal,Extremity Movements Upper (arms, wrists, hands, fingers): None, normal Lower (legs, knees, ankles, toes): None, normal, Trunk Movements Neck, shoulders, hips: None, normal, Overall Severity Severity of abnormal movements (highest score from questions above): None, normal Incapacitation due to abnormal movements: None, normal Patient's awareness of abnormal movements (rate only patient's report): No Awareness, Dental Status Current problems with teeth and/or dentures?: No Does patient usually wear dentures?: No  CIWA:  CIWA-Ar Total: 0  COWS:     Psychiatric Specialty Exam: See Psychiatric Specialty Exam and Suicide Risk Assessment completed by Attending Physician prior to discharge.  Discharge destination:  Home  Is patient on  multiple antipsychotic therapies at discharge:  No   Has Patient had three or more failed trials of antipsychotic monotherapy by history:  No  Recommended Plan for Multiple Antipsychotic Therapies: NA     Medication List     As of 10/03/2012 11:58 AM    STOP taking these medications         acetaminophen 500 MG tablet   Commonly known as: TYLENOL      albuterol 108 (90 BASE) MCG/ACT inhaler   Commonly known as: PROVENTIL HFA;VENTOLIN HFA      multivitamin with minerals Tabs      TAKE these medications      Indication    ARIPiprazole 5 MG tablet   Commonly known as: ABILIFY   Take 1 tablet (5 mg total) by mouth daily. For mood control        escitalopram 10 MG tablet   Commonly known as: LEXAPRO   Take 1 tablet (10 mg total) by mouth daily. For depression       gabapentin 300 MG capsule   Commonly known as: NEURONTIN   Take 1 capsule (300 mg total) by mouth 3 (three) times daily. For anxiety    Indication: Agitation, Cocaine Dependence      hydrOXYzine 50 MG tablet   Commonly known as: ATARAX/VISTARIL   Take 1 tablet (50 mg total) by mouth at bedtime as needed (insomnia). For anxiety          Follow-up Information    Follow up with [facility]. On 10/08/2012. (Walk in at 8:00 am with Dr. August Saucer)    [address]:   [address] [place], [place] [address] [phone]         Follow-up recommendations:  Activity:  as tolerated Other:  Keep all scheduled follow-up appointments as recommended.   Comments:  Take all your medications as prescribed by your mental healthcare provider. Report any adverse effects and or reactions from your medicines to your outpatient provider promptly. Patient is instructed and cautioned to not engage in alcohol and or illegal drug use while on prescription medicines. In the event of worsening symptoms, patient is instructed to call the crisis hotline, 911 and or go to the nearest ED for appropriate evaluation and treatment of symptoms. Follow-up with your primary care provider for your other medical issues, concerns and or health care needs.     Total Discharge Time:  Greater than 30 minutes.  SignedArmandina Stammer I 10/03/2012, 11:58 AM

## 2012-10-01 NOTE — Progress Notes (Signed)
Writer called MD to verify patient discharge and suicide risk assessment. MD stated that patient is not scheduled for discharge today. Order discontinued per MD. [facility] called and notified.

## 2012-10-01 NOTE — Progress Notes (Signed)
Pt reports he is doing well.  Pt denies any withdrawal symptoms.  He denies SI/HI/AV.  He reports that the circumstances surrounding his admission were a misunderstanding.  He said he had been drinking and had used cocaine, but he does not use all the time.  He said he never was suicidal.  He said when police came to his home, he thought someone was trying to break in because they did not have any blue lights flashing.  He could only see flashlights.  Pt's thoughts seemed clear/logical.  He has been pleasant/cooperative with this Clinical research associate.  Support/encouragement given.  Safety maintained with q15 minute checks.

## 2012-10-01 NOTE — Progress Notes (Signed)
Psychoeducational Group Note  Date:  10/01/2012 Time: 2000  Group Topic/Focus:  AA  Participation Level:  Minimal  Participation Quality:  Pt preoccupied  Affect:  Flat  Cognitive:  Alert  Insight:  Limited  Engagement in Group:  Limited  Additional Comments:  Pt attended part of the group after having an intense family session.  Humberto Seals Monique 10/01/2012, 10:08 PM

## 2012-10-01 NOTE — Progress Notes (Signed)
Psychoeducational Group Note  Date:  10/01/2012 Time:  1100  Group Topic/Focus:  Recovery Goals:   The focus of this group is to identify appropriate goals for recovery and establish a plan to achieve them.  Participation Level:  Active  Participation Quality:  Monopolizing  Affect: irritable   Cognitive:  Alert  Insight:  Distracting  Engagement in Group:  Distracting  Additional Comments:  Pt. Continuously got off track and topic. Staff redirected pt. To topic of personal recovery.   Justin Gross [place] 10/01/2012, 2:16 PM

## 2012-10-01 NOTE — Progress Notes (Signed)
[facility] MD Progress Note  10/01/2012 3:47 PM Justin Gross  MRN:  [id number] Subjective:  Justin Gross continues to minimize, rationalize, deny. He states that his wife did not accurately tell the magistrate what happened. States that she has issues herself. He denies ever threatening her. He minimizes his use of cocaine. States that the petition states that he had a long term problem with crack, what he denies. He says that he used cocaine only once in the last months when he went to see some friends. Moving forward he is agreeable not to go back to the house with her. He is looking at other options. He has his son validating what he is saying  Diagnosis:  Cocaine abuse, substance induced mood disorder  ADL's:  Intact  Sleep: Fair  Appetite:  Fair  Suicidal Ideation:  Plan:  denies Intent:  denies Means:  denies Homicidal Ideation:  Plan:  denies Intent:  denies Means:  denies AEB (as evidenced by):  Psychiatric Specialty Exam: Review of Systems  Constitutional: Negative.   HENT: Negative.   Eyes: Negative.   Respiratory: Negative.   Cardiovascular: Negative.   Gastrointestinal: Negative.   Genitourinary: Negative.   Musculoskeletal: Positive for back pain.  Skin: Negative.   Neurological: Negative.   Endo/Heme/Allergies: Negative.   Psychiatric/Behavioral: Positive for substance abuse. The patient is nervous/anxious.     Blood pressure 119/82, pulse 68, temperature 97.5 F (36.4 C), temperature source Oral, resp. rate 20.There is no height or weight on file to calculate BMI.  General Appearance: Fairly Groomed  Patent attorney::  Fair  Speech:  Clear and Coherent and rapid  Volume:  Normal  Mood:  Anxious and worried  Affect:  anxious  Thought Process:  Circumstantial, Coherent and Logical  Orientation:  Full (Time, Place, and Person)  Thought Content:  wanting to be discharged, not validating wife's statements on the petition  Suicidal Thoughts:  No  Homicidal Thoughts:  No    Memory:  Immediate;   Fair Recent;   Fair Remote;   Fair  Judgement:  Fair  Insight:  Present  Psychomotor Activity:  Restlessness  Concentration:  Fair  Recall:  Fair  Akathisia:  No  Handed:  Right  AIMS (if indicated):     Assets:  Desire for Improvement Social Support Vocational/Educational  Sleep:  Number of Hours: 3.75    Current Medications: Current Facility-Administered Medications  Medication Dose Route Frequency Provider Last Rate Last Dose  . acetaminophen (TYLENOL) tablet 650 mg  650 mg Oral Q6H PRN Shuvon Rankin, NP   650 mg at 09/30/12 2338  . alum & mag hydroxide-simeth (MAALOX/MYLANTA) 200-200-20 MG/5ML suspension 30 mL  30 mL Oral Q4H PRN Shuvon Rankin, NP      . ARIPiprazole (ABILIFY) tablet 5 mg  5 mg Oral Daily Mickeal Skinner, MD   5 mg at 10/01/12 4098  . escitalopram (LEXAPRO) tablet 10 mg  10 mg Oral Daily Mickeal Skinner, MD   10 mg at 10/01/12 1191  . hydrOXYzine (ATARAX/VISTARIL) tablet 50 mg  50 mg Oral QHS PRN Sanjuana Kava, NP   50 mg at 09/30/12 2339  . magnesium hydroxide (MILK OF MAGNESIA) suspension 30 mL  30 mL Oral Daily PRN Shuvon Rankin, NP        Lab Results: No results found for this or any previous visit (from the past 48 hour(s)).  Physical Findings: AIMS: Facial and Oral Movements Muscles of Facial Expression: None, normal Lips and Perioral Area: None, normal Jaw: None,  normal Tongue: None, normal,Extremity Movements Upper (arms, wrists, hands, fingers): None, normal Lower (legs, knees, ankles, toes): None, normal, Trunk Movements Neck, shoulders, hips: None, normal, Overall Severity Severity of abnormal movements (highest score from questions above): None, normal Incapacitation due to abnormal movements: None, normal Patient's awareness of abnormal movements (rate only patient's report): No Awareness, Dental Status Current problems with teeth and/or dentures?: No Does patient usually wear dentures?: No  CIWA:  CIWA-Ar Total: 0   COWS:     Treatment Plan Summary: Daily contact with patient to assess and evaluate symptoms and progress in treatment Medication management  Plan: Supportive approach/coping skills/relapse prevention           Continue medications             Continue to get more information/clarify risk Medical Decision Making Problem Points:  Established problem, stable/improving (1), Review of last therapy session (1) and Review of psycho-social stressors (1) Data Points:  Review of medication regiment & side effects (2)  I certify that inpatient services furnished can reasonably be expected to improve the patient's condition.   Amye Grego A 10/01/2012, 3:47 PM

## 2012-10-01 NOTE — Clinical Social Work Note (Signed)
[facility] LCSW Aftercare Discharge Planning Group Note  10/01/2012 8:45 AM  Participation Quality:  Appropriate and Attentive  Affect:  Appropriate  Cognitive:  Alert and Appropriate  Insight:  Developing/Improving  Engagement in Group:  Developing/Improving  Modes of Intervention:  Clarification, Discussion, Education, Exploration, Orientation, Problem-solving, Rapport Building, Socialization and Support  Summary of Progress/Problems: Pt attended discharge planning group and actively participated in group.  CSW provided pt with today's workbook.  Pt presents with calm mood and affect.  Pt denies having depression and rates anxiety at a 5 today.  Pt denies SI/HI.  Pt will follow up at [facility] for medication management and therapy.  No further needs voiced by pt at this time.  Safety planning and suicide prevention discussed.  Pt participated in discussion and acknowledged an understanding of the information provided.       Reyes Ivan, LCSWA 10/01/2012 10:57 AM

## 2012-10-01 NOTE — Clinical Social Work Note (Signed)
[facility] LCSW Group Therapy  10/01/2012  1:15 PM   Type of Therapy:  Group Therapy  Participation Level:  Active  Participation Quality:  Appropriate and Attentive  Affect:  Appropriate  Cognitive:  Alert and Appropriate  Insight:  Developing/Improving  Engagement in Therapy:  Developing/Improving  Modes of Intervention:  Clarification, Discussion, Education, Exploration, Problem-solving, Rapport Building, Socialization and Support  Summary of Progress/Problems: The topic for group therapy was feelings about diagnosis.  Pt actively participated in group discussion on their past and current diagnosis and how they feel towards this.  Pt also identified how society and family members judge them, based on their diagnosis as well as stereotypes and stigmas.  Pt discussed being told he was talking too fast by the dr over the weekend and doesn't agree with this.  Pt states that he doesn't know his diagnosis and was encouraged to talk to the doctor about it.  Pt agreed this could be helpful in understanding his symptoms.    Justin Gross, [place] 10/01/2012 2:56 PM

## 2012-10-01 NOTE — Progress Notes (Signed)
Pt reports he is frustrated that he was not discharged today based on what his wife told the case manager over the phone.  His son and daughter-in-law, who live in the home, are visiting at this time, and they say the wife has not been in the home for 3 days, but has been coming by, getting possessions out of the house.  Pt says he is not SI/HI, and never was.  He says he loves his wife and would never hurt her or his children.  They have been together for almost 30 years.  He voices no other complaints.  He denies withdrawal symptoms.  Support/encouragement given.  Safety maintained with q15 minute checks.

## 2012-10-02 MED ORDER — GABAPENTIN 300 MG PO CAPS
300.0000 mg | ORAL_CAPSULE | Freq: Three times a day (TID) | ORAL | Status: DC
  Administered 2012-10-02 – 2012-10-03 (×4): 300 mg via ORAL
  Filled 2012-10-02: qty 42
  Filled 2012-10-02 (×2): qty 1
  Filled 2012-10-02: qty 42
  Filled 2012-10-02 (×3): qty 1
  Filled 2012-10-02: qty 42
  Filled 2012-10-02: qty 1

## 2012-10-02 NOTE — Progress Notes (Signed)
Psychoeducational Group Note  Date:  10/02/2012 Time: 2000  Group Topic/Focus:  AA group  Participation Level:  Active  Participation Quality:  Appropriate  Affect:  Appropriate  Cognitive:  Appropriate  Insight:  Engaged  Engagement in Group:  Engaged  Additional Comments:    Gwenevere Ghazi Patience 10/02/2012, 10:55 PM

## 2012-10-02 NOTE — Progress Notes (Signed)
Pt reports he is doing well.  He is anticipating discharge home tomorrow.  His wife has moved out of the house and he says it will be fairly easy to avoid her.  MD started pt on a trial of Neurontin for his chronic back pain.  Pt reports that he can already feel a difference and hopes he can continue this medication.  Pt is in a good mood tonight.  He denies SI/HI/AV.  Support/encouragment given.  Safety maintained with q15 minute checks.

## 2012-10-02 NOTE — Progress Notes (Signed)
Patient ID: Justin Gross, male   DOB: Apr 09, 1966, 47 y.o.   MRN: [id number] He has been up and interacting with peers and staff part of the day. Has been in bed remainder of day.  Said that he was feeling better today. No thought of haring himself.

## 2012-10-02 NOTE — Progress Notes (Signed)
[facility] MD Progress Note  10/02/2012 1:49 PM Justin Gross  MRN:  [id number] Subjective:  As the hospitalization has progressed, he has acquired more and more insight. He learned that his wife has not been staying in the house and that she has taken things with her. His son is there as well as his fiancee. He will be going back home and understands wife might have gotten a 50 B and not be around. He states he is ready for it. He can visualize a life without her right now. He also states that he is going to be going out of town to work very often, and that he would not be seeing her anyway. He is very closed to the 47 Y/O. States that he would never do anything to hurt them. He is aware that he has to abstain from going around people who might be using cocaine. He is committed not to use. He states that he was not a heavy crack user as his wife claimed.  Diagnosis:  Cocaine abuse, substance induced mood disorder  ADL's:  Intact  Sleep: Fair  Appetite:  Fair  Suicidal Ideation:  Plan:  Denies Intent:  Denies Means:  Denies Homicidal Ideation:  Plan:  Denies Intent:  Denies Means:  Denies AEB (as evidenced by):  Psychiatric Specialty Exam: Review of Systems  Constitutional: Negative.   HENT: Negative.   Eyes: Negative.   Respiratory: Negative.   Cardiovascular: Negative.   Gastrointestinal: Negative.   Genitourinary: Negative.   Musculoskeletal: Positive for back pain.  Skin: Negative.   Neurological: Negative.   Endo/Heme/Allergies: Negative.   Psychiatric/Behavioral: Positive for substance abuse. The patient is nervous/anxious.     Blood pressure 115/81, pulse 86, temperature 98.4 F (36.9 C), temperature source Oral, resp. rate 16.There is no height or weight on file to calculate BMI.  General Appearance: Fairly Groomed  Patent attorney::  Fair  Speech:  Clear and Coherent and rapid  Volume:  Normal  Mood:  Anxious  Affect:  anxious  Thought Process:  Coherent and Goal Directed   Orientation:  Full (Time, Place, and Person)  Thought Content:  WDL and plans moving forward  Suicidal Thoughts:  No  Homicidal Thoughts:  No  Memory:  Immediate;   Fair Recent;   Fair Remote;   Fair  Judgement:  Fair  Insight:  Present  Psychomotor Activity:  Restlessness  Concentration:  Fair  Recall:  Fair  Akathisia:  No  Handed:  Right  AIMS (if indicated):     Assets:  Desire for Improvement Housing Vocational/Educational  Sleep:  Number of Hours: 3.5    Current Medications: Current Facility-Administered Medications  Medication Dose Route Frequency Provider Last Rate Last Dose  . acetaminophen (TYLENOL) tablet 650 mg  650 mg Oral Q6H PRN Shuvon Rankin, NP   650 mg at 10/01/12 2305  . alum & mag hydroxide-simeth (MAALOX/MYLANTA) 200-200-20 MG/5ML suspension 30 mL  30 mL Oral Q4H PRN Shuvon Rankin, NP   30 mL at 10/02/12 0906  . ARIPiprazole (ABILIFY) tablet 5 mg  5 mg Oral Daily Mickeal Skinner, MD   5 mg at 10/02/12 0904  . escitalopram (LEXAPRO) tablet 10 mg  10 mg Oral Daily Mickeal Skinner, MD   10 mg at 10/02/12 0905  . gabapentin (NEURONTIN) capsule 300 mg  300 mg Oral TID Rachael Fee, MD   300 mg at 10/02/12 1150  . hydrOXYzine (ATARAX/VISTARIL) tablet 50 mg  50 mg Oral QHS PRN Sanjuana Kava,  NP   50 mg at 10/01/12 2305  . magnesium hydroxide (MILK OF MAGNESIA) suspension 30 mL  30 mL Oral Daily PRN Shuvon Rankin, NP        Lab Results: No results found for this or any previous visit (from the past 48 hour(s)).  Physical Findings: AIMS: Facial and Oral Movements Muscles of Facial Expression: None, normal Lips and Perioral Area: None, normal Jaw: None, normal Tongue: None, normal,Extremity Movements Upper (arms, wrists, hands, fingers): None, normal Lower (legs, knees, ankles, toes): None, normal, Trunk Movements Neck, shoulders, hips: None, normal, Overall Severity Severity of abnormal movements (highest score from questions above): None,  normal Incapacitation due to abnormal movements: None, normal Patient's awareness of abnormal movements (rate only patient's report): No Awareness, Dental Status Current problems with teeth and/or dentures?: No Does patient usually wear dentures?: No  CIWA:  CIWA-Ar Total: 0  COWS:     Treatment Plan Summary: Daily contact with patient to assess and evaluate symptoms and progress in treatment Medication management  Plan: Supportive approach/coping skills/relapse prevention            Requested a trial with Neurontin given his back pain Medical Decision Making Problem Points:  Established problem, stable/improving (1), Review of last therapy session (1) and Review of psycho-social stressors (1) Data Points:  Review of medication regiment & side effects (2) Review of new medications or change in dosage (2)  I certify that inpatient services furnished can reasonably be expected to improve the patient's condition.   Taesha Goodell A 10/02/2012, 1:49 PM

## 2012-10-03 DIAGNOSIS — F3189 Other bipolar disorder: Principal | ICD-10-CM

## 2012-10-03 MED ORDER — ESCITALOPRAM OXALATE 10 MG PO TABS: 10.0000 mg | ORAL_TABLET | Freq: Every day | ORAL | Status: DC

## 2012-10-03 MED ORDER — HYDROXYZINE HCL 50 MG PO TABS: 50.0000 mg | ORAL_TABLET | Freq: Every evening | ORAL | Status: DC | PRN

## 2012-10-03 MED ORDER — GABAPENTIN 300 MG PO CAPS: 300.0000 mg | ORAL_CAPSULE | Freq: Three times a day (TID) | ORAL | Status: DC

## 2012-10-03 MED ORDER — ARIPIPRAZOLE 5 MG PO TABS: 5.0000 mg | ORAL_TABLET | Freq: Every day | ORAL | Status: DC

## 2012-10-03 NOTE — Clinical Social Work Note (Signed)
[facility] LCSW Group Therapy  10/03/2012  1:15 PM   Type of Therapy:  Group Therapy  Participation Level:  Did Not Attend group on balance in life   Justin Gross, LCSWA 10/03/2012 2:28 PM   

## 2012-10-03 NOTE — Progress Notes (Signed)
Patient ID: Justin Gross, male   DOB: [birth year], 47 y.o.   MRN: [id number]

## 2012-10-03 NOTE — Tx Team (Signed)
Interdisciplinary Treatment Plan Update (Adult)  Date:  10/03/2012  Time Reviewed:  10:45 AM   Progress in Treatment: Attending groups: Yes Participating in groups:  Yes Taking medication as prescribed: Yes Tolerating medication:  Yes Family/Significant othe contact made:  Yes Patient understands diagnosis:  Yes Discussing patient identified problems/goals with staff:  Yes Medical problems stabilized or resolved:  Yes Denies suicidal/homicidal ideation: Yes Issues/concerns per patient self-inventory:  None identified Other: N/A  New problem(s) identified: None Identified  Reason for Continuation of Hospitalization: Stable to d/c  Interventions implemented related to continuation of hospitalization: Stable to d/c  Additional comments: N/A  Estimated length of stay: D/C today  Discharge Plan: Pt will follow up with [facility] for medication management and therapy.    New goal(s): N/A  Review of initial/current patient goals per problem list:    1.  Goal(s): Address substance use by completing detox protocol  Met:  Yes  Target date: today   As evidenced by: attended groups and participated   Attendees: Patient:   10/03/2012 10:45 AM   Family:     Physician:  Geoffery Lyons, MD 10/03/2012 10:45 AM   Nursing:  10/03/2012 10:45 AM   Clinical Social Worker:  Reyes Ivan, LCSWA 10/03/2012 10:45 AM   Other:  10/03/2012 10:45 AM   Other:     Other:     Other:     Other:      Scribe for Treatment Team:   Reyes Ivan 10/03/2012 10:45 AM

## 2012-10-03 NOTE — Progress Notes (Signed)
[facility] Adult Case Management Discharge Plan :  Will you be returning to the same living situation after discharge: Yes,  returning home At discharge, do you have transportation home?:Yes,  access to transportation Do you have the ability to pay for your medications:Yes,  access to meds  Release of information consent forms completed and in the chart;  Patient's signature needed at discharge.  Patient to Follow up at: Follow-up Information    Follow up with [facility]. On 10/08/2012. (Walk in at 8:00 am with Dr. August Saucer)    [address]:   [address] [place], [place] [address] [phone]         Patient denies SI/HI:   Yes,  denies Si/HI    Safety Planning and Suicide Prevention discussed:  Yes,  discussed with pt and family  No recommendations from CSW.  No further needs voiced by pt.  Pt stable to discharge.  Pt gave consent to talk with his wife today.  CSW called pt's wife to inform her that pt is d/c.  Wife is still scared and feels at danger but reports taking out a 50-B against pt.  The order was served while pt was in the hospital.    Carmina Miller 10/03/2012, 1:04 PM

## 2012-10-03 NOTE — [facility] Suicide Risk Assessment (Signed)
Suicide Risk Assessment  Discharge Assessment     Demographic Factors:  Male and Caucasian  Mental Status Per Nursing Assessment::   On Admission:  NA  Current Mental Status by Physician: In full contact with reality. There are no suicidal ideas, plans or intent. There are no homicidal ideas, plans or intent. His mood is euthymic his affect is appropriate. He got served a 48-B today. He understands the implications and plans to comply. Continues to endorse that there is something wrong with the way his wife is acting. He is going back to the house, his son, and his fiancee are going to be there. They are supportive of him. He knows to avoid seeing his wife until court day. He is also insightful in that he should not go by the people who he used cocaine with. Understands how important is for him to abstain. There are no guns at the house.   Loss Factors: Loss of significant relationship  Historical Factors: NA  Risk Reduction Factors:   Responsible for children under 74 years of age, Sense of responsibility to family, Employed, Living with another person, especially a relative and Positive social support  Continued Clinical Symptoms:  Alcohol/Substance Abuse/Dependencies  Cognitive Features That Contribute To Risk: None identified    Suicide Risk:  Minimal: No identifiable suicidal ideation.  Patients presenting with no risk factors but with morbid ruminations; may be classified as minimal risk based on the severity of the depressive symptoms  Discharge Diagnoses:   AXIS I:  Cocaine abuse. Substance induced mood disorder AXIS II:  Deferred AXIS III:   Past Medical History  Diagnosis Date  . Colitis   . Mental disorder    AXIS IV:  problems with primary support group AXIS V:  61-70 mild symptoms  Plan Of Care/Follow-up recommendations:  Activity:  As tolerated Diet:  Regular Follow up [facility] Is patient on multiple antipsychotic therapies at discharge:  No   Has Patient  had three or more failed trials of antipsychotic monotherapy by history:  No  Recommended Plan for Multiple Antipsychotic Therapies: N/A   Hever Castilleja A 10/03/2012, 1:14 PM

## 2012-10-03 NOTE — Clinical Social Work Note (Signed)
[facility] LCSW Aftercare Discharge Planning Group Note  10/03/2012 8:45 AM  Participation Quality:  Did Not Attend    Verneal Wiers Horton, LCSWA 10/03/2012 10:20 AM   

## 2012-10-07 NOTE — Progress Notes (Signed)
Patient Discharge Instructions:  After Visit Summary (AVS):   Faxed to:  10/07/12 Psychiatric Admission Assessment Note:   Faxed to:  10/07/12 Suicide Risk Assessment - Discharge Assessment:   Faxed to:  10/07/12 Faxed/Sent to the Next Level Care provider:  10/07/12 Faxed to [facility] @ [phone]  Jerelene Redden, 10/07/2012, 3:51 PM

## 2012-10-09 NOTE — Discharge Summary (Signed)
Agree with assessment and plan Ondra Deboard A. Blayden Conwell, M.D. 

## 2013-09-03 ENCOUNTER — Emergency Department (HOSPITAL_COMMUNITY): Payer: Self-pay

## 2013-09-03 ENCOUNTER — Emergency Department (HOSPITAL_COMMUNITY)
Admission: EM | Admit: 2013-09-03 | Discharge: 2013-09-03 | Disposition: A | Discharge status: Home / Self Care | Payer: Self-pay | Attending: Emergency Medicine | Admitting: Emergency Medicine

## 2013-09-03 ENCOUNTER — Encounter (HOSPITAL_COMMUNITY): Payer: Self-pay | Admitting: Emergency Medicine

## 2013-09-03 DIAGNOSIS — Z8659 Personal history of other mental and behavioral disorders: Secondary | ICD-10-CM | POA: Insufficient documentation

## 2013-09-03 DIAGNOSIS — Z880 Allergy status to penicillin: Secondary | ICD-10-CM | POA: Insufficient documentation

## 2013-09-03 DIAGNOSIS — Z8719 Personal history of other diseases of the digestive system: Secondary | ICD-10-CM | POA: Insufficient documentation

## 2013-09-03 DIAGNOSIS — L03211 Cellulitis of face: Secondary | ICD-10-CM | POA: Insufficient documentation

## 2013-09-03 DIAGNOSIS — L0201 Cutaneous abscess of face: Secondary | ICD-10-CM | POA: Insufficient documentation

## 2013-09-03 LAB — POCT I-STAT, CHEM 8
BUN: 13 mg/dL (ref 6–23)
Creatinine, Ser: 1.4 mg/dL — ABNORMAL HIGH (ref 0.50–1.35)
Potassium: 3.7 mEq/L (ref 3.5–5.1)
Sodium: 141 mEq/L (ref 135–145)

## 2013-09-03 MED ORDER — HYDROCODONE-ACETAMINOPHEN 5-325 MG PO TABS: 1.0000 | ORAL_TABLET | Freq: Four times a day (QID) | ORAL | Status: DC | PRN

## 2013-09-03 MED ORDER — CLINDAMYCIN HCL 150 MG PO CAPS: 300.0000 mg | ORAL_CAPSULE | Freq: Three times a day (TID) | ORAL | Status: DC

## 2013-09-03 MED ORDER — IOHEXOL 300 MG/ML  SOLN
80.0000 mL | Freq: Once | INTRAMUSCULAR | Status: AC | PRN
  Administered 2013-09-03: 80 mL via INTRAVENOUS

## 2013-09-03 MED ORDER — CLINDAMYCIN PHOSPHATE 600 MG/50ML IV SOLN
600.0000 mg | Freq: Once | INTRAVENOUS | Status: AC
  Administered 2013-09-03: 600 mg via INTRAVENOUS
  Filled 2013-09-03 (×2): qty 50

## 2013-09-03 MED ORDER — MORPHINE SULFATE 4 MG/ML IJ SOLN
4.0000 mg | Freq: Once | INTRAMUSCULAR | Status: AC
  Administered 2013-09-03: 4 mg via INTRAVENOUS
  Filled 2013-09-03: qty 1

## 2013-09-03 MED ORDER — ONDANSETRON HCL 4 MG/2ML IJ SOLN
4.0000 mg | Freq: Once | INTRAMUSCULAR | Status: AC
  Administered 2013-09-03: 4 mg via INTRAVENOUS
  Filled 2013-09-03: qty 2

## 2013-09-03 NOTE — ED Provider Notes (Signed)
Patient left facial swelling, signed out to me by PA Trahan at shift change. Patient with pending CT of the face to rule out a deep abscess. Clindamycin and pain medications ordered.  Results for orders placed during the hospital encounter of 09/03/13  POCT I-STAT, CHEM 8      Result Value Range   Sodium 141  135 - 145 mEq/L   Potassium 3.7  3.5 - 5.1 mEq/L   Chloride 103  96 - 112 mEq/L   BUN 13  6 - 23 mg/dL   Creatinine, Ser 0.45 (*) 0.50 - 1.35 mg/dL   Glucose, Bld 88  70 - 99 mg/dL   Calcium, Ion 4.09  8.11 - 1.23 mmol/L   TCO2 27  0 - 100 mmol/L   Hemoglobin 13.9  13.0 - 17.0 g/dL   HCT 91.4  78.2 - 95.6 %   Ct Maxillofacial W/cm  09/03/2013   CLINICAL DATA:  Facial swelling left side, rule out abscess.  EXAM: CT MAXILLOFACIAL WITH CONTRAST  TECHNIQUE: Multidetector CT imaging of the maxillofacial structures was performed with intravenous contrast. Multiplanar CT image reconstructions were also generated. A small metallic BB was placed on the right temple in order to reliably differentiate right from left.  CONTRAST:  80mL OMNIPAQUE IOHEXOL 300 MG/ML  SOLN  COMPARISON:  None.  FINDINGS: Orbits are normal and symmetric. Paranasal sinuses are well developed and well aerated without significant opacification or air-fluid levels. Mastoid air cells are clear. The external auditory canals, middle air cavity and internal auditory canals are normal and symmetric. There is mild soft tissue swelling/subcutaneous edema over the left anterior lateral face. There is no significant adenopathy. There is no definite focal abscess seen. Airway is patent. Remaining bony structures are unremarkable.  IMPRESSION: Nonspecific mild soft tissue swelling over the anterior lateral left base without evidence of underlying abscess.   Electronically Signed   By: Elberta Fortis M.D.   On: 09/03/2013 21:23    9:42 PM CT is back with no obvious abscess. There is mild soft tissue swelling of the left  face. Patient  received clindamycin emergency department. I will discharge him home with clindamycin and pain medication for his infection and pain. He'll followup with primary care Dr. in 3 days. I advised him to return if symptoms are worsening.  Filed Vitals:   09/03/13 1906  BP: 117/68  Pulse: 85  Temp: 98.3 F (36.8 C)  TempSrc: Oral  Resp: 16  Height: 5\' 10"  (1.778 m)  Weight: 160 lb (72.576 kg)  SpO2: 98%     Lottie Mussel, PA-C 09/03/13 2143

## 2013-09-03 NOTE — ED Notes (Signed)
Received pt from [facility]

## 2013-09-03 NOTE — ED Provider Notes (Signed)
CSN: [id number]     Arrival date & time 09/03/13  1846 History  This chart was scribed for Fayrene Helper, PA, working with Donnetta Hutching, MD, by [facility] ED Scribe. This patient was seen in room WTR9/WTR9 and the patient's care was started at 7:21 PM.   Chief Complaint  Patient presents with  . Facial Swelling    The history is provided by the patient. No language interpreter was used.    HPI Comments: Justin Gross is a 47 y.o. male who presents to the Emergency Department complaining of an area of gradually worsening left-sided facial swelling over the past 4 days. He reports associated intermittent "throbbing" pain to the area. He also describes his pain as "soreness". He states that he has taken Ibuprofen without relief of his pain. He suspects that he may have been bitten by a spider, but has not specifically seen any spiders. He denies having a history of diabetes. He states that he has a history of colitis, but is otherwise healthy. He denies any other pain or symptoms.   Past Medical History  Diagnosis Date  . Colitis   . Mental disorder    Past Surgical History  Procedure Laterality Date  . Knee surgery     History reviewed. No pertinent family history. History  Substance Use Topics  . Smoking status: Never Smoker   . Smokeless tobacco: Not on file  . Alcohol Use: Yes     Comment: occasionally    Review of Systems  Constitutional: Negative for fever and chills.  HENT: Positive for facial swelling.   Gastrointestinal: Negative for nausea and vomiting.  All other systems reviewed and are negative.   Allergies  Codeine; Penicillins; and Tramadol  Home Medications   Current Outpatient Rx  Name  Route  Sig  Dispense  Refill  . ibuprofen (ADVIL,MOTRIN) 200 MG tablet   Oral   Take 400 mg by mouth every 6 (six) hours as needed.          Triage Vitals: BP 117/68  Pulse 85  Temp(Src) 98.3 F (36.8 C) (Oral)  Resp 16  Ht 5\' 10"  (1.778 m)  Wt 160 lb (72.576  kg)  BMI 22.96 kg/m2  SpO2 98%  Physical Exam  Nursing note and vitals reviewed. Constitutional: He is oriented to person, place, and time. He appears well-developed and well-nourished. No distress.  HENT:  Head: Normocephalic and atraumatic.  Left submaxillary lymphadenopathy noted. Area of induration and mild fluctuance noted to the left upper jaw, anterior to the left ear, with an ulcerated skin lesion. Moderately tender to palpation. Some dental decay noted to the left lower molar, without any obvious abscess or gingivitis.   Eyes: EOM are normal.  Neck: Neck supple. No tracheal deviation present.  Cardiovascular: Normal rate.   Pulmonary/Chest: Effort normal. No respiratory distress.  Musculoskeletal: Normal range of motion.  Neurological: He is alert and oriented to person, place, and time.  Skin: Skin is warm and dry.  Psychiatric: He has a normal mood and affect. His behavior is normal.    ED Course  Procedures (including critical care time)  DIAGNOSTIC STUDIES: Oxygen Saturation is 98% on RA, normal by my interpretation.    COORDINATION OF CARE: 7:29 PM- Discussed plan to obtain a maxillofacial CT. Will order medications while pt is in the ED. Pt advised of plan for treatment and pt agrees.  7:58 PM Care discussed with oncoming PA who will continue to monitor and care for pt pending  CT.  If pt has facial abscess, he may benefit from I&D.  Pt currently receiving IV clinda, pain medication and will check labs.    Medications  clindamycin (CLEOCIN) IVPB 600 mg (not administered)  morphine 4 MG/ML injection 4 mg (not administered)  ondansetron (ZOFRAN) injection 4 mg (not administered)   Labs Review Labs Reviewed - No data to display Imaging Review No results found.  EKG Interpretation   None       MDM  No diagnosis found.  BP 117/68  Pulse 85  Temp(Src) 98.3 F (36.8 C) (Oral)  Resp 16  Ht 5\' 10"  (1.778 m)  Wt 160 lb (72.576 kg)  BMI 22.96 kg/m2  SpO2  98%  I personally performed the services described in this documentation, which was scribed in my presence. The recorded information has been reviewed and is accurate.     Fayrene Helper, PA-C 09/03/13 2000

## 2013-09-03 NOTE — ED Notes (Signed)
Pt states onset painful swelling to L side of face. Pt denies itching, denies known insect bit. Swelling progressively worsened over past couple days. Pt states took ibuprofen with no relief.

## 2013-09-15 NOTE — ED Provider Notes (Signed)
Medical screening examination/treatment/procedure(s) were performed by non-physician practitioner and as supervising physician I was immediately available for consultation/collaboration.  EKG Interpretation   None        Donnetta Hutching, MD 09/15/13 [phone]

## 2013-09-15 NOTE — ED Provider Notes (Signed)
Medical screening examination/treatment/procedure(s) were performed by non-physician practitioner and as supervising physician I was immediately available for consultation/collaboration.  EKG Interpretation   None        Donnetta Hutching, MD 09/15/13 [phone]

## 2014-11-04 ENCOUNTER — Encounter (HOSPITAL_COMMUNITY): Payer: Self-pay

## 2014-11-04 ENCOUNTER — Emergency Department (HOSPITAL_COMMUNITY)
Admission: EM | Admit: 2014-11-04 | Discharge: 2014-11-04 | Disposition: A | Discharge status: Home / Self Care | Payer: No Typology Code available for payment source | Attending: Emergency Medicine | Admitting: Emergency Medicine

## 2014-11-04 ENCOUNTER — Emergency Department (HOSPITAL_COMMUNITY): Payer: No Typology Code available for payment source

## 2014-11-04 DIAGNOSIS — Z792 Long term (current) use of antibiotics: Secondary | ICD-10-CM | POA: Insufficient documentation

## 2014-11-04 DIAGNOSIS — S4991XA Unspecified injury of right shoulder and upper arm, initial encounter: Secondary | ICD-10-CM | POA: Insufficient documentation

## 2014-11-04 DIAGNOSIS — S3992XA Unspecified injury of lower back, initial encounter: Secondary | ICD-10-CM | POA: Insufficient documentation

## 2014-11-04 DIAGNOSIS — Y9389 Activity, other specified: Secondary | ICD-10-CM | POA: Diagnosis not present

## 2014-11-04 DIAGNOSIS — Z880 Allergy status to penicillin: Secondary | ICD-10-CM | POA: Insufficient documentation

## 2014-11-04 DIAGNOSIS — Y998 Other external cause status: Secondary | ICD-10-CM | POA: Insufficient documentation

## 2014-11-04 DIAGNOSIS — Z8659 Personal history of other mental and behavioral disorders: Secondary | ICD-10-CM | POA: Insufficient documentation

## 2014-11-04 DIAGNOSIS — S199XXA Unspecified injury of neck, initial encounter: Secondary | ICD-10-CM | POA: Diagnosis not present

## 2014-11-04 DIAGNOSIS — M2560 Stiffness of unspecified joint, not elsewhere classified: Secondary | ICD-10-CM

## 2014-11-04 DIAGNOSIS — Y92410 Unspecified street and highway as the place of occurrence of the external cause: Secondary | ICD-10-CM | POA: Insufficient documentation

## 2014-11-04 DIAGNOSIS — Z8719 Personal history of other diseases of the digestive system: Secondary | ICD-10-CM | POA: Insufficient documentation

## 2014-11-04 DIAGNOSIS — M549 Dorsalgia, unspecified: Secondary | ICD-10-CM

## 2014-11-04 DIAGNOSIS — S8002XA Contusion of left knee, initial encounter: Secondary | ICD-10-CM | POA: Diagnosis not present

## 2014-11-04 MED ORDER — METHOCARBAMOL 500 MG PO TABS: 500.0000 mg | ORAL_TABLET | Freq: Two times a day (BID) | ORAL | Status: DC

## 2014-11-04 MED ORDER — ONDANSETRON 4 MG PO TBDP: 4.0000 mg | ORAL_TABLET | Freq: Three times a day (TID) | ORAL | Status: AC | PRN

## 2014-11-04 MED ORDER — HYDROCODONE-ACETAMINOPHEN 5-325 MG PO TABS: 1.0000 | ORAL_TABLET | ORAL | Status: DC | PRN

## 2014-11-04 MED ORDER — IBUPROFEN 800 MG PO TABS
800.0000 mg | ORAL_TABLET | Freq: Once | ORAL | Status: AC
  Administered 2014-11-04: 800 mg via ORAL
  Filled 2014-11-04: qty 1

## 2014-11-04 NOTE — Discharge Instructions (Signed)
Take the prescribed medication as directed. You will likely continue to be sore for the next few days, this should gradually start to improve. Return to the ED for new or worsening symptoms.
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
## 2016-12-05 ENCOUNTER — Encounter (HOSPITAL_COMMUNITY): Payer: Self-pay | Admitting: Emergency Medicine

## 2016-12-05 DIAGNOSIS — M5441 Lumbago with sciatica, right side: Secondary | ICD-10-CM | POA: Insufficient documentation

## 2016-12-05 NOTE — ED Triage Notes (Addendum)
Pt to ED from home c/o mid to lower R sided back pain that shoots down through R buttock and R leg with numbness in R leg. Denies urinary/bowel incontinence, no injury. Walks with limp d/t pain. Patient adds SOB with exertion intermittently over the past 4-5 days as well but not at this time, denies cough/fevers.

## 2016-12-06 ENCOUNTER — Emergency Department (HOSPITAL_COMMUNITY)
Admission: EM | Admit: 2016-12-06 | Discharge: 2016-12-06 | Disposition: A | Discharge status: Home / Self Care | Payer: Self-pay | Attending: Emergency Medicine | Admitting: Emergency Medicine

## 2016-12-06 DIAGNOSIS — M5431 Sciatica, right side: Secondary | ICD-10-CM

## 2016-12-06 MED ORDER — IBUPROFEN 400 MG PO TABS
600.0000 mg | ORAL_TABLET | Freq: Once | ORAL | Status: AC
  Administered 2016-12-06: 600 mg via ORAL
  Filled 2016-12-06: qty 1

## 2016-12-06 MED ORDER — DEXAMETHASONE SODIUM PHOSPHATE 10 MG/ML IJ SOLN
10.0000 mg | Freq: Once | INTRAMUSCULAR | Status: AC
  Administered 2016-12-06: 10 mg via INTRAMUSCULAR
  Filled 2016-12-06: qty 1

## 2016-12-06 MED ORDER — OXYCODONE HCL 5 MG PO TABS
10.0000 mg | ORAL_TABLET | Freq: Once | ORAL | Status: AC
Start: 2016-12-06 — End: 2016-12-06
  Administered 2016-12-06: 10 mg via ORAL
  Filled 2016-12-06: qty 2

## 2016-12-06 MED ORDER — OXYCODONE HCL 5 MG PO TABS: 5.0000 mg | ORAL_TABLET | Freq: Two times a day (BID) | ORAL | 0 refills | Status: AC | PRN

## 2016-12-06 MED ORDER — LIDOCAINE 5 % EX PTCH
1.0000 | MEDICATED_PATCH | Freq: Once | CUTANEOUS | Status: DC
  Administered 2016-12-06: 1 via TRANSDERMAL
  Filled 2016-12-06: qty 1

## 2016-12-06 NOTE — ED Provider Notes (Signed)
[facility]-EMERGENCY DEPT Provider Note   CSN: [id number] Arrival date & time: 12/05/16  2339   By signing my name below, I, Clovis Pu, attest that this documentation has been prepared under the direction and in the presence of Tomasita Crumble, MD  Electronically Signed: Clovis Pu, ED Scribe. 12/06/16. 1:14 AM.   History   Chief Complaint Chief Complaint  Patient presents with  . Back Pain  . Shortness of Breath   The history is provided by the patient. No language interpreter was used.   HPI Comments:  Justin Gross is a 51 y.o. male who presents to the Emergency Department complaining of acute onset, moderate, lower right sided back pain that shoots down his right lower extremity which began 1 week ago. He also reports numbness/tingling in his right lower extremity. Pt reports his pain is worse when bearing weight and with movement of his right lower extremity. He has taken Tylenol with no relief. Pt denies bowel/bladder incontinence, any recent trauma/injury to his back, any recent surgeries or any other associated symptoms. No other complaints noted.   Past Medical History:  Diagnosis Date  . Colitis   . Mental disorder     Patient Active Problem List   Diagnosis Date Noted  . Substance induced mood disorder (HCC) 09/29/2012    Class: Chronic  . Cocaine abuse 09/29/2012    Class: Acute    Past Surgical History:  Procedure Laterality Date  . KNEE SURGERY         Home Medications    Prior to Admission medications   Medication Sig Start Date End Date Taking? Authorizing Provider  acetaminophen (TYLENOL) 500 MG tablet Take 1,000 mg by mouth every 6 (six) hours as needed for mild pain.   Yes Historical Provider, MD  ibuprofen (ADVIL,MOTRIN) 200 MG tablet Take 400 mg by mouth every 6 (six) hours as needed for moderate pain.    Yes Historical Provider, MD  ondansetron (ZOFRAN ODT) 4 MG disintegrating tablet Take 1 tablet (4 mg total) by mouth every 8 (eight) hours as  needed for nausea. 11/04/14  Yes Garlon Hatchet, PA-C    Family History No family history on file.  Social History Social History  Substance Use Topics  . Smoking status: Never Smoker  . Smokeless tobacco: Never Used  . Alcohol use Yes     Comment: occasionally     Allergies   Codeine; Penicillins; and Tramadol   Review of Systems Review of Systems 10 Systems reviewed and are negative for acute change except as noted in the HPI.  Physical Exam Updated Vital Signs BP 122/75 (BP Location: Left Arm)   Pulse 82   Temp 98 F (36.7 C) (Oral)   Resp 18   SpO2 98%   Physical Exam  Constitutional: He is oriented to person, place, and time. Vital signs are normal. He appears well-developed and well-nourished.  Non-toxic appearance. He does not appear ill. No distress.  HENT:  Head: Normocephalic and atraumatic.  Nose: Nose normal.  Mouth/Throat: Oropharynx is clear and moist. No oropharyngeal exudate.  Eyes: Conjunctivae and EOM are normal. Pupils are equal, round, and reactive to light. No scleral icterus.  Neck: Normal range of motion. Neck supple. No tracheal deviation, no edema, no erythema and normal range of motion present. No thyroid mass and no thyromegaly present.  Cardiovascular: Normal rate, regular rhythm, S1 normal, S2 normal, normal heart sounds, intact distal pulses and normal pulses.  Exam reveals no gallop and no friction rub.  No murmur heard. Pulmonary/Chest: Effort normal and breath sounds normal. No respiratory distress. He has no wheezes. He has no rhonchi. He has no rales.  Abdominal: Soft. Normal appearance and bowel sounds are normal. He exhibits no distension, no ascites and no mass. There is no hepatosplenomegaly. There is no tenderness. There is no rebound, no guarding and no CVA tenderness.  Musculoskeletal: Normal range of motion. He exhibits no edema or tenderness.  + straight leg raise on the right   Lymphadenopathy:    He has no cervical  adenopathy.  Neurological: He is alert and oriented to person, place, and time. He has normal strength. No cranial nerve deficit or sensory deficit.  Strength 5/5 in all 4 extremities. Normal cerebellar testing  Skin: Skin is warm, dry and intact. No petechiae and no rash noted. He is not diaphoretic. No erythema. No pallor.  Nursing note and vitals reviewed.    ED Treatments / Results  DIAGNOSTIC STUDIES:  Oxygen Saturation is 98% on RA, normal by my interpretation.    COORDINATION OF CARE:  1:10 AM Discussed treatment plan with pt at bedside and pt agreed to plan.  Labs (all labs ordered are listed, but only abnormal results are displayed) Labs Reviewed - No data to display  EKG  EKG Interpretation None       Radiology No results found.  Procedures Procedures (including critical care time)  Medications Ordered in ED Medications  lidocaine (LIDODERM) 5 % 1 patch (not administered)  dexamethasone (DECADRON) injection 10 mg (not administered)  ibuprofen (ADVIL,MOTRIN) tablet 600 mg (not administered)  oxyCODONE (Oxy IR/ROXICODONE) immediate release tablet 10 mg (not administered)     Initial Impression / Assessment and Plan / ED Course  I have reviewed the triage vital signs and the nursing notes.  Pertinent labs & imaging results that were available during my care of the patient were reviewed by me and considered in my medical decision making (see chart for details).     Patient presents to the ED for back pain consistent with sciatica. He was given lidocaine patch, oxycodone, ibuprofen, and decadron for pain.  Plan to DC with oxycodone to take as needed for severe pain, 10 tabs.  Neuro exam is normal, no history of incontinence.     Upon repeat evaluation, patient does feel better.  VS are normal. Patient is safe for DC.     Final Clinical Impressions(s) / ED Diagnoses   Final diagnoses:  None    New Prescriptions New Prescriptions   No medications  on file    I personally performed the services described in this documentation, which was scribed in my presence. The recorded information has been reviewed and is accurate.       Tomasita CrumbleAdeleke Nashid Pellum, MD 12/06/16 [phone]

## 2022-01-12 ENCOUNTER — Telehealth: Payer: Self-pay | Admitting: Emergency Medicine

## 2022-01-12 ENCOUNTER — Ambulatory Visit: Payer: Self-pay | Admitting: *Deleted

## 2022-01-12 DIAGNOSIS — R21 Rash and other nonspecific skin eruption: Secondary | ICD-10-CM

## 2022-01-12 NOTE — Telephone Encounter (Signed)
Patient stated he was told by a pharmacist that it looks like he has shingles and he needs to be seen by a doctor. Patient requests call back from a nurse to discuss options for care since there are no new patient appts available in the next few days. Patient stated it is very painful so he needs some guidance on what he should do. Cb# [phone]  ? ? ?Attempted to reach pt, left VM to call back. ?

## 2022-01-12 NOTE — Telephone Encounter (Signed)
?  Chief Complaint: Rash ?Symptoms: widespread rash, vary in size, some blistered. On legs waist arms chest head. Subjective fever, ankles swelling ?Frequency: 5 weeks ?Pertinent Negatives: Patient denies  ?Disposition: [] ED /[] Urgent Care (no appt availability in office) / [] Appointment(In office/virtual)/ [x]  [facility]/ [] Home Care/ [] Refused Recommended Disposition /[] [facility]/ []  Follow-up with PCP ?Additional Notes: [facility]. No PCP ?Reason for Disposition ? Large or small blisters on skin (i.e., fluid filled bubbles or sacs) ? ?Answer Assessment - Initial Assessment Questions ?1. APPEARANCE of RASH: "Describe the rash." (e.g., spots, blisters, raised areas, skin peeling, scaly) ?    Red raised, blistered ?2. SIZE: "How big are the spots?" (e.g., tip of pen, eraser, coin; inches, centimeters) ?    Some in a line.  Size vary ?3. LOCATION: "Where is the rash located?" ?    Both calves, neck and in head, waist ?4. COLOR: "What color is the rash?" (Note: It is difficult to assess rash color in people with darker-colored skin. When this situation occurs, simply ask the caller to describe what they see.) ?    red ?5. ONSET: "When did the rash begin?" ?    5th week ?6. FEVER: "Do you have a fever?" If Yes, ask: "What is your temperature, how was it measured, and when did it start?" ?    Sweating, chills ?7. ITCHING: "Does the rash itch?" If Yes, ask: "How bad is the itch?" (Scale 1-10; or mild, moderate, severe) ?    Itchy and painful ?8. CAUSE: "What do you think is causing the rash?" ?    unsure ?9. MEDICINE FACTORS: "Have you started any new medicines within the last 2 weeks?" (e.g., antibiotics)  ?    no ?10. OTHER SYMPTOMS: "Do you have any other symptoms?" (e.g., dizziness, headache, sore throat, joint pain) ?      Ankles swollen 3x's size ? ?Protocols used: Rash or Redness - Widespread-A-AH ? ?

## 2022-01-12 NOTE — Progress Notes (Signed)
?Virtual Visit Consent  ? ?Mardene Speak, you are scheduled for a virtual visit with a [facility] provider today.   ?  ?Just as with appointments in the office, your consent must be obtained to participate.  Your consent will be active for this visit and any virtual visit you may have with one of our providers in the next 365 days.   ?  ?If you have a MyChart account, a copy of this consent can be sent to you electronically.  All virtual visits are billed to your insurance company just like a traditional visit in the office.   ? ?As this is a virtual visit, video technology does not allow for your provider to perform a traditional examination.  This may limit your provider's ability to fully assess your condition.  If your provider identifies any concerns that need to be evaluated in person or the need to arrange testing (such as labs, EKG, etc.), we will make arrangements to do so.   ?  ?Although advances in technology are sophisticated, we cannot ensure that it will always work on either your end or our end.  If the connection with a video visit is poor, the visit may have to be switched to a telephone visit.  With either a video or telephone visit, we are not always able to ensure that we have a secure connection.    ? ?I need to obtain your verbal consent now.   Are you willing to proceed with your visit today?  ?  ?Justin Gross has provided verbal consent on 01/12/2022 for a virtual visit (video or telephone). ?  ?Roxy Horseman, PA-C  ? ?Date: 01/12/2022 7:43 PM ? ? ?Virtual Visit via Video Note  ? ?Justin Gross, connected with  Justin Gross  ([id number], August 03, 1966) on 01/12/22 at  7:30 PM EDT by a video-enabled telemedicine application and verified that I am speaking with the correct person using two identifiers. ? ?Location: ?Patient: Virtual Visit Location Patient: Home ?Provider: Virtual Visit Location Provider: Home Office ?  ?I discussed the limitations of evaluation and management by  telemedicine and the availability of in person appointments. The patient expressed understanding and agreed to proceed.   ? ?History of Present Illness: ?Justin Gross is a 56 y.o. who identifies as a male who was assigned male at birth, and is being seen today for rash x 5 weeks.  He is concerned that it is shingles.  Rash is on all extremities, buttocks, chest, and neck.  Denies fever.  States that it burns.  Reports that blisters form and the break and the liquid burns and scars. ? ?HPI: HPI  ?Problems:  ?Patient Active Problem List  ? Diagnosis Date Noted  ? Substance induced mood disorder (HCC) 09/29/2012  ?  Class: Chronic  ? Cocaine abuse (HCC) 09/29/2012  ?  Class: Acute  ?  ?Allergies:  ?Allergies  ?Allergen Reactions  ? Codeine Nausea Only  ? Penicillins Other (See Comments)  ?  Causes chron's flare up  ? Tramadol Nausea Only  ? ?Medications:  ?Current Outpatient Medications:  ?  acetaminophen (TYLENOL) 500 MG tablet, Take 1,000 mg by mouth every 6 (six) hours as needed for mild pain., Disp: , Rfl:  ?  ibuprofen (ADVIL,MOTRIN) 200 MG tablet, Take 400 mg by mouth every 6 (six) hours as needed for moderate pain. , Disp: , Rfl:  ?  ondansetron (ZOFRAN ODT) 4 MG disintegrating tablet, Take 1 tablet (4 mg total) by  mouth every 8 (eight) hours as needed for nausea., Disp: 10 tablet, Rfl: 0 ?  oxyCODONE (ROXICODONE) 5 MG immediate release tablet, Take 1 tablet (5 mg total) by mouth 2 (two) times daily as needed for severe pain., Disp: 10 tablet, Rfl: 0 ? ?Observations/Objective: ?Patient is well-developed, well-nourished in no acute distress.  ?Resting comfortably  at home.  ?Head is normocephalic, atraumatic.  ?No labored breathing.  ?Speech is clear and coherent with logical content.  ?Patient is alert and oriented at baseline.  ?Weeping open wounds on extremities and chest ? ?Assessment and Plan: ?1. Rash ?5 weeks of rash of uncertain etiology.  I recommended that the patient be seen in person tomorrow at  and urgent care.  He is agreeable with this plan and says he didn't go today because he didn't want to expose people to shingles. ? ?Follow Up Instructions: ?I discussed the assessment and treatment plan with the patient. The patient was provided an opportunity to ask questions and all were answered. The patient agreed with the plan and demonstrated an understanding of the instructions.  A copy of instructions were sent to the patient via MyChart unless otherwise noted below.  ? ? ?The patient was advised to call back or seek an in-person evaluation if the symptoms worsen or if the condition fails to improve as anticipated. ? ?Time:  ?I spent 28 minutes with the patient via telehealth technology discussing the above problems/concerns.   ? ?Roxy Horseman, PA-C ? ?

## 2022-01-12 NOTE — Patient Instructions (Signed)
Based on what you shared with me, I feel your condition warrants further evaluation and I recommend that you be seen in a face to face visit. °  °NOTE: There will be NO CHARGE for this Visit °  °If you are having a true medical emergency please call 911.   °  ° For an urgent face to face visit, [place] has six urgent care centers for your convenience:  °  ° [facility] [place] °Get Driving Directions °[phone] °[address] °[place], [place] [address] °  ° [facility] ([place] °Get Driving Directions °[phone] °[address] °[place], [place] [address] ° °[facility] [place] °Get Driving Directions °[phone] °[address] °[place],  [place]  [address] ° °[facility] °Get Driving Directions °[phone] °[address] °[place], [place] [address] °  °[facility] [place] °Get Driving Directions  °[phone] °[address] °[address] °[place], [place] [address] °  ° [facility] [place] °Get Driving Directions °[phone] °[address] °[place],  [address] ° ° °
